# Patient Record
Sex: Female | Born: 1995 | Race: Black or African American | Hispanic: No | Marital: Single | Smoking: Never smoker
Health system: Southern US, Community
[De-identification: ages and names within clinical notes are randomized; demographics above are authoritative.]

## PROBLEM LIST (undated history)

## (undated) DIAGNOSIS — N83209 Unspecified ovarian cyst, unspecified side: Secondary | ICD-10-CM

## (undated) DIAGNOSIS — A749 Chlamydial infection, unspecified: Secondary | ICD-10-CM

## (undated) DIAGNOSIS — D649 Anemia, unspecified: Secondary | ICD-10-CM

## (undated) HISTORY — PX: BREAST REDUCTION SURGERY: SHX8

## (undated) HISTORY — PX: WISDOM TOOTH EXTRACTION: SHX21

## (undated) HISTORY — PX: FRACTURE SURGERY: SHX138

---

## 2015-08-23 ENCOUNTER — Other Ambulatory Visit: Payer: Self-pay | Admitting: Nurse Practitioner

## 2015-08-23 DIAGNOSIS — R102 Pelvic and perineal pain: Secondary | ICD-10-CM

## 2015-08-27 ENCOUNTER — Other Ambulatory Visit: Payer: Self-pay

## 2015-09-03 ENCOUNTER — Other Ambulatory Visit: Payer: Self-pay

## 2015-10-05 ENCOUNTER — Emergency Department (HOSPITAL_COMMUNITY)
Admission: EM | Admit: 2015-10-05 | Discharge: 2015-10-05 | Disposition: A | Discharge status: Home / Self Care | Payer: BLUE CROSS/BLUE SHIELD | Attending: Emergency Medicine | Admitting: Emergency Medicine

## 2015-10-05 ENCOUNTER — Inpatient Hospital Stay (HOSPITAL_COMMUNITY)
Admission: AD | Admit: 2015-10-05 | Discharge: 2015-10-05 | Disposition: A | Discharge status: Home / Self Care | Payer: BLUE CROSS/BLUE SHIELD | Source: Ambulatory Visit | Attending: Obstetrics and Gynecology | Admitting: Obstetrics and Gynecology

## 2015-10-05 ENCOUNTER — Encounter (HOSPITAL_COMMUNITY): Payer: Self-pay | Admitting: Emergency Medicine

## 2015-10-05 ENCOUNTER — Encounter (HOSPITAL_COMMUNITY): Payer: Self-pay | Admitting: *Deleted

## 2015-10-05 DIAGNOSIS — Z8619 Personal history of other infectious and parasitic diseases: Secondary | ICD-10-CM | POA: Diagnosis not present

## 2015-10-05 DIAGNOSIS — R600 Localized edema: Secondary | ICD-10-CM | POA: Diagnosis not present

## 2015-10-05 DIAGNOSIS — Z720 Tobacco use: Secondary | ICD-10-CM | POA: Insufficient documentation

## 2015-10-05 DIAGNOSIS — R102 Pelvic and perineal pain: Secondary | ICD-10-CM | POA: Insufficient documentation

## 2015-10-05 DIAGNOSIS — N898 Other specified noninflammatory disorders of vagina: Secondary | ICD-10-CM

## 2015-10-05 DIAGNOSIS — L089 Local infection of the skin and subcutaneous tissue, unspecified: Secondary | ICD-10-CM | POA: Diagnosis not present

## 2015-10-05 HISTORY — DX: Chlamydial infection, unspecified: A74.9

## 2015-10-05 HISTORY — DX: Unspecified ovarian cyst, unspecified side: N83.209

## 2015-10-05 LAB — CBC WITH DIFFERENTIAL/PLATELET
Basophils Absolute: 0 10*3/uL (ref 0.0–0.1)
Basophils Relative: 1 %
EOS PCT: 2 %
Eosinophils Absolute: 0.1 10*3/uL (ref 0.0–0.7)
HCT: 34.4 % — ABNORMAL LOW (ref 36.0–46.0)
Hemoglobin: 11 g/dL — ABNORMAL LOW (ref 12.0–15.0)
LYMPHS ABS: 2.2 10*3/uL (ref 0.7–4.0)
LYMPHS PCT: 26 %
MCH: 26.1 pg (ref 26.0–34.0)
MCHC: 32 g/dL (ref 30.0–36.0)
MCV: 81.7 fL (ref 78.0–100.0)
MONO ABS: 0.5 10*3/uL (ref 0.1–1.0)
Monocytes Relative: 6 %
Neutro Abs: 5.7 10*3/uL (ref 1.7–7.7)
Neutrophils Relative %: 65 %
PLATELETS: 325 10*3/uL (ref 150–400)
RBC: 4.21 MIL/uL (ref 3.87–5.11)
RDW: 13.5 % (ref 11.5–15.5)
WBC: 8.5 10*3/uL (ref 4.0–10.5)

## 2015-10-05 LAB — WET PREP, GENITAL
Trich, Wet Prep: NONE SEEN
YEAST WET PREP: NONE SEEN

## 2015-10-05 LAB — URINALYSIS, ROUTINE W REFLEX MICROSCOPIC
Bilirubin Urine: NEGATIVE
Glucose, UA: NEGATIVE mg/dL
Hgb urine dipstick: NEGATIVE
Ketones, ur: NEGATIVE mg/dL
LEUKOCYTES UA: NEGATIVE
NITRITE: NEGATIVE
Protein, ur: NEGATIVE mg/dL
SPECIFIC GRAVITY, URINE: 1.02 (ref 1.005–1.030)
UROBILINOGEN UA: 0.2 mg/dL (ref 0.0–1.0)
pH: 7 (ref 5.0–8.0)

## 2015-10-05 LAB — POCT PREGNANCY, URINE: PREG TEST UR: NEGATIVE

## 2015-10-05 MED ORDER — IBUPROFEN 600 MG PO TABS: 600.0000 mg | ORAL_TABLET | Freq: Four times a day (QID) | ORAL | Status: DC | PRN

## 2015-10-05 MED ORDER — IBUPROFEN 600 MG PO TABS
600.0000 mg | ORAL_TABLET | Freq: Once | ORAL | Status: AC
  Administered 2015-10-05: 600 mg via ORAL
  Filled 2015-10-05: qty 1

## 2015-10-05 MED ORDER — TRAMADOL HCL 50 MG PO TABS: 100.0000 mg | ORAL_TABLET | Freq: Four times a day (QID) | ORAL | Status: DC | PRN

## 2015-10-05 MED ORDER — CEPHALEXIN 500 MG PO CAPS: 500.0000 mg | ORAL_CAPSULE | Freq: Four times a day (QID) | ORAL | Status: DC

## 2015-10-05 MED ORDER — TRAMADOL HCL 50 MG PO TABS
100.0000 mg | ORAL_TABLET | Freq: Once | ORAL | Status: AC
  Administered 2015-10-05: 100 mg via ORAL
  Filled 2015-10-05: qty 2

## 2015-10-05 MED ORDER — CEPHALEXIN 500 MG PO CAPS
500.0000 mg | ORAL_CAPSULE | Freq: Once | ORAL | Status: AC
  Administered 2015-10-05: 500 mg via ORAL
  Filled 2015-10-05: qty 1

## 2015-10-05 NOTE — Discharge Instructions (Signed)

## 2015-10-05 NOTE — ED Notes (Signed)
Patient states has "something covering up the hole of my vagina".  Patient states unsure if skin or a cyst?   Patient states is having pain from same.   Patient states she is also having vaginal discharge.   Patient denies other symptoms.

## 2015-10-05 NOTE — MAU Provider Note (Signed)
Chief Complaint: vaginal mass    First Provider Initiated Contact with Patient 10/05/15 1724     SUBJECTIVE HPI: Amber HidesJohnaisha Solis is a 19 y.o. G1P0010 female who was [facility] ED for further eval of vaginal pain and mass. Sx started 1 week ago. No Hx similar Sx. [facility], but pain got so bad that she could wait.    Location: vagina Quality: pressure, tenderness Severity: 5/10 on pain scale Duration: 1 week Course: worsening Context: None Timing: constant Modifying factors: worse w/ sitting and pressure. Associated signs and symptoms: Positive for vaginal discharge. Neg for fever, chills, VB, drainage from mass, injury.  Last IC three weeks ago, consensual, no trauma, but did use a sex toy. No Hx of genital piercings.   Past Medical History  Diagnosis Date  . Ovarian cyst   . Chlamydia    OB History  Gravida Para Term Preterm AB SAB TAB Ectopic Multiple Living  1    1  1        # Outcome Date GA Lbr Len/2nd Weight Sex Delivery Anes PTL Lv  1 TAB              Past Surgical History  Procedure Laterality Date  . Wisdom tooth extraction     Social History   Social History  . Marital Status: Single    Spouse Name: N/A  . Number of Children: N/A  . Years of Education: N/A   Occupational History  . Not on file.   Social History Main Topics  . Smoking status: Current Every Day Smoker    Types: Cigars  . Smokeless tobacco: Not on file  . Alcohol Use: Yes     Comment: socially  . Drug Use: 7.00 per week    Special: Marijuana     Comment: Last used 10/03/15  . Sexual Activity: Yes   Other Topics Concern  . Not on file   Social History Narrative   No current facility-administered medications on file prior to encounter.   No current outpatient prescriptions on file prior to encounter.   No Known Allergies  I have reviewed the past Medical Hx, Surgical Hx, Social Hx, Allergies and Medications.    Review of Systems  Constitutional: Negative for fever and chills.  Gastrointestinal: Negative for abdominal pain.  Genitourinary: Positive for vaginal discharge and vaginal pain. Negative for dysuria, urgency, frequency, hematuria, decreased urine volume, vaginal bleeding, genital sores and pelvic pain.    OBJECTIVE Patient Vitals for the past 24 hrs:  BP Temp Temp src Pulse Resp  10/05/15 1641 125/67 mmHg 98.4 F (36.9 C) Oral 99 18   Constitutional: Well-developed, well-nourished female in mild distress.  Cardiovascular: normal rate Respiratory: normal rate and effort.  GI: Abd soft, non-tender. MS: Extremities nontender, no edema, normal ROM Neurologic: Alert and oriented x 4.  GU: PELVIC EXAM: NEFG. Anterior vaginal wall swollen, erythematous, very tender, non-fluctuant. Physiologic discharge, no blood noted. Unable to tolerate speculum of bimanual exam. Blind wet prep and GC/Chlamydia cultures collected.   LAB RESULTS Results for orders placed or performed during the hospital encounter of 10/05/15 (from the past 24 hour(s))  Urinalysis, Routine w reflex microscopic (not at [facility])     Status: None   Collection Time: 10/05/15  4:45 PM  Result Value Ref Range   Color, Urine YELLOW YELLOW   APPearance CLEAR CLEAR   Specific Gravity, Urine 1.020 1.005 - 1.030   pH 7.0 5.0 -  8.0   Glucose, UA NEGATIVE NEGATIVE mg/dL   Hgb urine dipstick NEGATIVE NEGATIVE   Bilirubin Urine NEGATIVE NEGATIVE   Ketones, ur NEGATIVE NEGATIVE mg/dL   Protein, ur NEGATIVE NEGATIVE mg/dL   Urobilinogen, UA 0.2 0.0 - 1.0 mg/dL   Nitrite NEGATIVE NEGATIVE   Leukocytes, UA NEGATIVE NEGATIVE  Pregnancy, urine POC     Status: None   Collection Time: 10/05/15  4:59 PM  Result Value Ref Range   Preg Test, Ur NEGATIVE NEGATIVE  Wet prep, genital     Status: Abnormal   Collection Time: 10/05/15  5:25 PM  Result Value Ref Range   Yeast Wet Prep HPF POC NONE SEEN NONE SEEN   Trich, Wet Prep NONE SEEN  NONE SEEN   Clue Cells Wet Prep HPF POC FEW (A) NONE SEEN   WBC, Wet Prep HPF POC FEW (A) NONE SEEN  CBC with Differential/Platelet     Status: Abnormal   Collection Time: 10/05/15  5:50 PM  Result Value Ref Range   WBC 8.5 4.0 - 10.5 K/uL   RBC 4.21 3.87 - 5.11 MIL/uL   Hemoglobin 11.0 (L) 12.0 - 15.0 g/dL   HCT 40.9 (L) 81.1 - 91.4 %   MCV 81.7 78.0 - 100.0 fL   MCH 26.1 26.0 - 34.0 pg   MCHC 32.0 30.0 - 36.0 g/dL   RDW 78.2 95.6 - 21.3 %   Platelets 325 150 - 400 K/uL   Neutrophils Relative % 65 %   Neutro Abs 5.7 1.7 - 7.7 K/uL   Lymphocytes Relative 26 %   Lymphs Abs 2.2 0.7 - 4.0 K/uL   Monocytes Relative 6 %   Monocytes Absolute 0.5 0.1 - 1.0 K/uL   Eosinophils Relative 2 %   Eosinophils Absolute 0.1 0.0 - 0.7 K/uL   Basophils Relative 1 %   Basophils Absolute 0.0 0.0 - 0.1 K/uL    IMAGING No results found.  MAU COURSE/MDM Discussed symptoms, exam and history with Dr. Jolayne Panther. Symptoms most likely caused by soft tissue infection. Recommends treatment with Keflex and follow up with gynecologist if no improvement in 3 days.  CBC, Ultram, Keflex.  ASSESSMENT 1. Soft tissue infection   2. Vaginal mass     PLAN Discharge home in stable condition. Pelvic rest until Sx resolve.  Frequent soaks and warm compresses.       Follow-up Information    [facility].   Why:  As scheduled      Follow up with [facility] [place] [facility].   Why:  As needed if symptoms do not improve or worsen in 72 hours   Contact information:   [address] [id number] mc [place] [address] [phone]       Medication List    TAKE these medications        cephALEXin 500 MG capsule  Commonly known as:  KEFLEX  Take 1 capsule (500 mg total) by mouth 4 (four) times daily.     cetirizine 10 MG tablet  Commonly known as:  ZYRTEC  Take 10 mg by mouth daily as needed for allergies.      ibuprofen 600 MG tablet  Commonly known as:  ADVIL,MOTRIN  Take 1 tablet (600 mg total) by mouth every 6 (six) hours as needed for moderate pain.     traMADol 50 MG tablet  Commonly known as:  ULTRAM  Take 2 tablets (100 mg total) by mouth every 6 (  six) hours as needed. For breakthrough pain.       Grant, CNM 10/05/2015  6:12 PM

## 2015-10-05 NOTE — MAU Note (Signed)
Pt sent to MAU from [facility].  Pt was told she has ? Vaginal mass.  Is very painful to touch & to sit, has been there a week.  Denies bleeding, has white discharge.

## 2015-10-05 NOTE — Progress Notes (Signed)
Written and verbal d/c instructions given and understanding voiced. 

## 2015-10-05 NOTE — Discharge Instructions (Signed)
Proceed to women's outpatient clinic for further evaluation by Dr. Ladean Rayaonstance.

## 2015-10-05 NOTE — ED Provider Notes (Signed)
CSN: [id number]     Arrival date & time 10/05/15  1422 History   First MD Initiated Contact with Patient 10/05/15 1518     Chief Complaint  Patient presents with  . Vaginal Pain     (Consider location/radiation/quality/duration/timing/severity/associated sxs/prior Treatment) Patient is a 19 y.o. female presenting with vaginal pain. The history is provided by the patient. No language interpreter was used.  Vaginal Pain Pertinent negatives include no abdominal pain or fever.  Mrs. Tech is a 19 year old female with a history of ovarian cyst who presents stating that she feels as though something is covering of the whole of her vagina. She states she noticed it last week and feels that it is hard. She reports having a history of chlamydia in the past. Also complaining of nonodorous white vaginal discharge. She states she is sexually active but without penetration and with females only. She denies any fever, chills, abdominal pain, nausea, vomiting, vaginal bleeding, vaginal itching or odor. Her LMP was 09/22/2015.  Past Medical History  Diagnosis Date  . Ovarian cyst    History reviewed. No pertinent past surgical history. No family history on file. Social History  Substance Use Topics  . Smoking status: Current Every Day Smoker    Types: Cigars  . Smokeless tobacco: None  . Alcohol Use: Yes     Comment: socially   OB History    No data available     Review of Systems  Constitutional: Negative for fever.  Gastrointestinal: Negative for abdominal pain.  Genitourinary: Positive for vaginal discharge and vaginal pain. Negative for vaginal bleeding.  All other systems reviewed and are negative.     Allergies  Review of patient's allergies indicates no known allergies.  Home Medications   Prior to Admission medications   Not on File   BP 134/80 mmHg  Pulse 86  Temp(Src) 98.1 F (36.7 C) (Oral)  Resp 18  Ht  (1.702 m)  Wt 224 lb (101.606 kg)  BMI 35.08 kg/m2   SpO2 100%  LMP 09/22/2015 Physical Exam  Constitutional: She is oriented to person, place, and time. She appears well-developed and well-nourished.  HENT:  Head: Normocephalic and atraumatic.  Eyes: Conjunctivae are normal.  Neck: Neck supple.  Cardiovascular: Normal rate.   Pulmonary/Chest: Effort normal. No respiratory distress.  Abdominal: Soft. There is no tenderness. There is no rebound and no guarding.  No abdominal tenderness to palpation. No guarding or rebound.  Genitourinary: Pelvic exam was performed with patient supine.  Pelvic exam: Chaperone present. There is a indurated area covering the opening of the vagina which is tender to palpation. It is also draining. No vaginal, uterine, or rectal prolapse.  There is no vaginal bleeding. Speculum exam could not be performed due to mass. Cervix could not be seen.  Musculoskeletal: Normal range of motion.  Neurological: She is alert and oriented to person, place, and time.  Skin: Skin is warm and dry.    ED Course  Procedures (including critical care time) Labs Review Labs Reviewed  URINALYSIS, ROUTINE W REFLEX MICROSCOPIC (NOT AT [facility])    Imaging Review No results found.   EKG Interpretation None      MDM   Final diagnoses:  Vaginal mass   Patient presents for mass at the vaginal opening. It does not appear to be a prolapse or a cyst. It also does not appear to be an abscess. She is afebrile and vitals are stable. I discussed this patient with Dr. Lynelle Doctor who is  seen and evaluated the patient. He recommended calling GYN to see if the patient would need antibiotics or an ultrasound of the pelvis. I spoke to Dr. Ladean Rayaonstance with GYN who recommended that the patient be transferred over to MAU to be evaluated by her. I explained the plan with the patient and she will be driving herself to MAU.    Catha GosselinHanna Patel-Mills, PA-C 10/05/15 1610  Nelva Nayobert Beaton, MD 10/06/15 [id number]

## 2015-10-06 LAB — GC/CHLAMYDIA PROBE AMP (~~LOC~~) NOT AT ARMC
Chlamydia: NEGATIVE
NEISSERIA GONORRHEA: NEGATIVE

## 2015-10-09 ENCOUNTER — Inpatient Hospital Stay (HOSPITAL_COMMUNITY)
Admission: AD | Admit: 2015-10-09 | Discharge: 2015-10-09 | Disposition: A | Discharge status: Home / Self Care | Payer: BLUE CROSS/BLUE SHIELD | Source: Ambulatory Visit | Attending: Family Medicine | Admitting: Family Medicine

## 2015-10-09 ENCOUNTER — Encounter (HOSPITAL_COMMUNITY): Payer: Self-pay | Admitting: *Deleted

## 2015-10-09 DIAGNOSIS — N899 Noninflammatory disorder of vagina, unspecified: Secondary | ICD-10-CM | POA: Insufficient documentation

## 2015-10-09 DIAGNOSIS — M549 Dorsalgia, unspecified: Secondary | ICD-10-CM | POA: Diagnosis present

## 2015-10-09 DIAGNOSIS — F17200 Nicotine dependence, unspecified, uncomplicated: Secondary | ICD-10-CM | POA: Insufficient documentation

## 2015-10-09 DIAGNOSIS — N898 Other specified noninflammatory disorders of vagina: Secondary | ICD-10-CM

## 2015-10-09 HISTORY — DX: Anemia, unspecified: D64.9

## 2015-10-09 NOTE — Discharge Instructions (Signed)
Cephalexin tablets or capsules  What is this medicine?  CEPHALEXIN (sef a LEX in) is a cephalosporin antibiotic. It is used to treat certain kinds of bacterial infections It will not work for colds, flu, or other viral infections.  This medicine may be used for other purposes; ask your health care provider or pharmacist if you have questions.  What should I tell my health care provider before I take this medicine?  They need to know if you have any of these conditions:  -kidney disease  -stomach or intestine problems, especially colitis  -an unusual or allergic reaction to cephalexin, other cephalosporins, penicillins, other antibiotics, medicines, foods, dyes or preservatives  -pregnant or trying to get pregnant  -breast-feeding  How should I use this medicine?  Take this medicine by mouth with a full glass of water. Follow the directions on the prescription label. This medicine can be taken with or without food. Take your medicine at regular intervals. Do not take your medicine more often than directed. Take all of your medicine as directed even if you think you are better. Do not skip doses or stop your medicine early.  Talk to your pediatrician regarding the use of this medicine in children. While this drug may be prescribed for selected conditions, precautions do apply.  Overdosage: If you think you have taken too much of this medicine contact a poison control center or emergency room at once.  NOTE: This medicine is only for you. Do not share this medicine with others.  What if I miss a dose?  If you miss a dose, take it as soon as you can. If it is almost time for your next dose, take only that dose. Do not take double or extra doses. There should be at least 4 to 6 hours between doses.  What may interact with this medicine?  -probenecid  -some other antibiotics  This list may not describe all possible interactions. Give your health care provider a list of all the medicines, herbs, non-prescription drugs, or  dietary supplements you use. Also tell them if you smoke, drink alcohol, or use illegal drugs. Some items may interact with your medicine.  What should I watch for while using this medicine?  Tell your doctor or health care professional if your symptoms do not begin to improve in a few days.  Do not treat diarrhea with over the counter products. Contact your doctor if you have diarrhea that lasts more than 2 days or if it is severe and watery.  If you have diabetes, you may get a false-positive result for sugar in your urine. Check with your doctor or health care professional.  What side effects may I notice from receiving this medicine?  Side effects that you should report to your doctor or health care professional as soon as possible:  -allergic reactions like skin rash, itching or hives, swelling of the face, lips, or tongue  -breathing problems  -pain or trouble passing urine  -redness, blistering, peeling or loosening of the skin, including inside the mouth  -severe or watery diarrhea  -unusually weak or tired  -yellowing of the eyes, skin  Side effects that usually do not require medical attention (report to your doctor or health care professional if they continue or are bothersome):  -gas or heartburn  -genital or anal irritation  -headache  -joint or muscle pain  -nausea, vomiting  This list may not describe all possible side effects. Call your doctor for medical advice about side effects.   You may report side effects to FDA at 1-800-FDA-1088.  Where should I keep my medicine?  Keep out of the reach of children.  Store at room temperature between 59 and 86 degrees F (15 and 30 degrees C). Throw away any unused medicine after the expiration date.  NOTE: This sheet is a summary. It may not cover all possible information. If you have questions about this medicine, talk to your doctor, pharmacist, or health care provider.     © 2016, [employer]vier/Gold Standard. (2008-02-17 17:09:13)

## 2015-10-09 NOTE — MAU Note (Addendum)
Day after she was here, had a lot of brown d/c.  Smear of blood yesterday. Some back pain last couple days.  Pain today and small amt of bleeding.  Did NOT have appt yesterday- they were closed.  Came in today because of the pain and she was unsure about the blood

## 2015-10-09 NOTE — MAU Provider Note (Signed)
  History     CSN: [id number]  Arrival date and time: 10/09/15 1831   First Provider Initiated Contact with Patient 10/09/15 1911      Chief Complaint  Patient presents with  . Back Pain  . Vaginal Bleeding   HPI  Amber Solis 19 y.o. G1P0010 presents because she is having vaginal spotting and some back pain. She was treated with Keflex states the mass has gone down . She has had brown discharge yesterday.  Past Medical History  Diagnosis Date  . Ovarian cyst   . Chlamydia   . Anemia     Past Surgical History  Procedure Laterality Date  . Wisdom tooth extraction      History reviewed. No pertinent family history.  Social History  Substance Use Topics  . Smoking status: Current Every Day Smoker    Types: Cigars  . Smokeless tobacco: None  . Alcohol Use: Yes     Comment: socially    Allergies: No Known Allergies  Prescriptions prior to admission  Medication Sig Dispense Refill Last Dose  . cephALEXin (KEFLEX) 500 MG capsule Take 1 capsule (500 mg total) by mouth 4 (four) times daily. 28 capsule 0 10/08/2015 at Unknown time  . ibuprofen (ADVIL,MOTRIN) 600 MG tablet Take 1 tablet (600 mg total) by mouth every 6 (six) hours as needed for moderate pain. 30 tablet 0 10/08/2015 at Unknown time  . traMADol (ULTRAM) 50 MG tablet Take 2 tablets (100 mg total) by mouth every 6 (six) hours as needed. For breakthrough pain. 30 tablet 0 10/08/2015 at Unknown time  . cetirizine (ZYRTEC) 10 MG tablet Take 10 mg by mouth daily as needed for allergies.   prn    Review of Systems  Gastrointestinal: Positive for abdominal pain.  Genitourinary:       Vaginal spotting  Musculoskeletal: Positive for back pain.  All other systems reviewed and are negative.  Physical Exam   Blood pressure 139/73, pulse 87, temperature 97.8 F (36.6 C), temperature source Oral, resp. rate 18, last menstrual period 09/22/2015.  Physical Exam  Nursing note and vitals reviewed. Constitutional:  She is oriented to person, place, and time. She appears well-developed and well-nourished. No distress.  HENT:  Head: Normocephalic and atraumatic.  Neck: Normal range of motion.  Cardiovascular: Normal rate.   Respiratory: Effort normal. No respiratory distress.  GI: Soft.  Genitourinary: Vagina normal.  Musculoskeletal: Normal range of motion.  Neurological: She is alert and oriented to person, place, and time.  Skin: Skin is warm and dry.  Psychiatric: She has a normal mood and affect. Her behavior is normal. Judgment and thought content normal.    MAU Course  Procedures  MDM Continue prescribed treatment [facility]. No results found for this or any previous visit (from the past 24 hour(s)). Assessment and Plan  Vaginal Mass  Discharge to home  [facility] CtrClemmons,Rui Wordell Grissett 10/09/2015, 7:24 PM

## 2017-06-02 ENCOUNTER — Encounter (HOSPITAL_COMMUNITY): Payer: Self-pay | Admitting: *Deleted

## 2017-06-02 ENCOUNTER — Ambulatory Visit (HOSPITAL_COMMUNITY)
Admission: EM | Admit: 2017-06-02 | Discharge: 2017-06-02 | Disposition: A | Discharge status: Home / Self Care | Payer: 59 | Attending: Family Medicine | Admitting: Family Medicine

## 2017-06-02 DIAGNOSIS — R21 Rash and other nonspecific skin eruption: Secondary | ICD-10-CM | POA: Diagnosis not present

## 2017-06-02 DIAGNOSIS — S90861A Insect bite (nonvenomous), right foot, initial encounter: Secondary | ICD-10-CM

## 2017-06-02 DIAGNOSIS — W57XXXA Bitten or stung by nonvenomous insect and other nonvenomous arthropods, initial encounter: Secondary | ICD-10-CM | POA: Diagnosis not present

## 2017-06-02 DIAGNOSIS — S90862A Insect bite (nonvenomous), left foot, initial encounter: Secondary | ICD-10-CM

## 2017-06-02 MED ORDER — TRIAMCINOLONE ACETONIDE 0.1 % EX CREA: 1.0000 "application " | TOPICAL_CREAM | Freq: Two times a day (BID) | CUTANEOUS | 0 refills | Status: AC

## 2017-06-02 NOTE — ED Provider Notes (Signed)
CSN: [id number]     Arrival date & time 06/02/17  1452 History   None    Chief Complaint  Patient presents with  . Foot Problem   (Consider location/radiation/quality/duration/timing/severity/associated sxs/prior Treatment)  Rash  Location:  Foot Foot rash location:  R foot and L foot Quality: itchiness and redness   Quality: not blistering, not burning, not draining, not painful, not swelling and not weeping   Onset quality:  Sudden Duration:  3 days Timing:  Constant Progression:  Worsening Chronicity:  New Context: not animal contact, not exposure to similar rash, not insect bite/sting, not medications, not new detergent/soap and not plant contact   Relieved by:  Nothing Worsened by:  Nothing Ineffective treatments:  None tried   Past Medical History:  Diagnosis Date  . Anemia   . Chlamydia   . Ovarian cyst    Past Surgical History:  Procedure Laterality Date  . WISDOM TOOTH EXTRACTION     History reviewed. No pertinent family history. Social History  Substance Use Topics  . Smoking status: Current Every Day Smoker    Types: Cigars  . Smokeless tobacco: Not on file  . Alcohol use Yes     Comment: socially   OB History    Gravida Para Term Preterm AB Living   1       1     SAB TAB Ectopic Multiple Live Births     1           Review of Systems  Constitutional:       See HPI  Skin: Positive for rash.    Allergies  Patient has no known allergies.  Home Medications   Prior to Admission medications   Medication Sig Start Date End Date Taking? Authorizing Provider  cephALEXin (KEFLEX) 500 MG capsule Take 1 capsule (500 mg total) by mouth 4 (four) times daily. 10/05/15   Katrinka Blazing, [place], CNM  cetirizine (ZYRTEC) 10 MG tablet Take 10 mg by mouth daily as needed for allergies.    [provider]  ibuprofen (ADVIL,MOTRIN) 600 MG tablet Take 1 tablet (600 mg total) by mouth every 6 (six) hours as needed for moderate pain. 10/05/15   Katrinka Blazing, [place], CNM   traMADol (ULTRAM) 50 MG tablet Take 2 tablets (100 mg total) by mouth every 6 (six) hours as needed. For breakthrough pain. 10/05/15   Katrinka Blazing, [place], CNM  triamcinolone cream (KENALOG) 0.1 % Apply 1 application topically 2 (two) times daily. 06/02/17 06/09/17  Lucia Estelle, NP   Meds Ordered and Administered this Visit  Medications - No data to display  BP 130/69 (BP Location: Right Arm)   Pulse 75   Temp 98.7 F (37.1 C) (Oral)   Resp 18   LMP 06/02/2017   SpO2 100%  No data found.   Physical Exam  Constitutional: She is oriented to person, place, and time. She appears well-developed and well-nourished.  Cardiovascular: Normal rate.   Pulmonary/Chest: Effort normal.  Neurological: She is alert and oriented to person, place, and time.  Skin:  Erythematous distinct papular rash on feet and some on lower legs  Nursing note and vitals reviewed.   Urgent Care Course     Procedures (including critical care time)  Labs Review Labs Reviewed - No data to display  Imaging Review No results found.  MDM   1. Insect bite, initial encounter    RX for Kenalog cream 0/1% send in.  Reviewed directions for usage and side effects. Patient states understanding and  will call with questions or problems. Patient instructed to call or follow up with his/her primary care doctor if failure to improve or change in symptoms. Discharge instruction given.     Lucia EstelleZheng, Rachard Isidro, NP 06/02/17 1622

## 2017-06-02 NOTE — ED Triage Notes (Signed)
Bumps  On  Both   Feet  X   3  Days       Itches        no  Known  Causative         Agent  No  New  Shoes

## 2017-09-08 ENCOUNTER — Ambulatory Visit (HOSPITAL_COMMUNITY)
Admission: EM | Admit: 2017-09-08 | Discharge: 2017-09-08 | Disposition: A | Discharge status: Home / Self Care | Payer: 59 | Attending: Internal Medicine | Admitting: Internal Medicine

## 2017-09-08 ENCOUNTER — Encounter (HOSPITAL_COMMUNITY): Payer: Self-pay | Admitting: Emergency Medicine

## 2017-09-08 DIAGNOSIS — J209 Acute bronchitis, unspecified: Secondary | ICD-10-CM

## 2017-09-08 MED ORDER — BENZONATATE 200 MG PO CAPS: 200.0000 mg | ORAL_CAPSULE | Freq: Three times a day (TID) | ORAL | 1 refills | Status: DC | PRN

## 2017-09-08 MED ORDER — PREDNISONE 50 MG PO TABS: 50.0000 mg | ORAL_TABLET | Freq: Every day | ORAL | 0 refills | Status: DC

## 2017-09-08 NOTE — Discharge Instructions (Addendum)
Anticipate gradual improvement in cough and well-being over the next several days. Cough may take a couple weeks to subside. Prescriptions for prednisone (a steroid, to help with cough and congestion), and for benzonatate, for cough, were sent to the pharmacy. Rest and push fluids.  Recheck for new fever >100.5, increasing phlegm production/nasal discharge, or if not starting to improve in a few days.

## 2017-09-08 NOTE — ED Provider Notes (Signed)
[facility]    CSN: [id number] Arrival date & time: 09/08/17  1601     History   Chief Complaint Chief Complaint  Patient presents with  . URI    HPI Amber Solis is a 21 y.o. female. She presents today with about a 6 day history of head congestion, some runny nose, cough, headache. She has some tactile temperature. She has some discomfort in her back when she coughs. She started wheezing yesterday. Appetite is a little decreased and she has mild nausea. No vomiting. Some loose stools yesterday. Mild sore throat initially.    HPI  Past Medical History:  Diagnosis Date  . Anemia   . Chlamydia   . Ovarian cyst     Past Surgical History:  Procedure Laterality Date  . WISDOM TOOTH EXTRACTION      OB History    Gravida Para Term Preterm AB Living   1       1     SAB TAB Ectopic Multiple Live Births     1             Home Medications    Prior to Admission medications   Medication Sig Start Date End Date Taking? Authorizing Provider  benzonatate (TESSALON) 200 MG capsule Take 1 capsule (200 mg total) by mouth 3 (three) times daily as needed for cough. 09/08/17   Eustace Moore, MD  cetirizine (ZYRTEC) 10 MG tablet Take 10 mg by mouth daily as needed for allergies.    [provider]  ibuprofen (ADVIL,MOTRIN) 600 MG tablet Take 1 tablet (600 mg total) by mouth every 6 (six) hours as needed for moderate pain. 10/05/15   Katrinka Blazing, [place], CNM  predniSONE (DELTASONE) 50 MG tablet Take 1 tablet (50 mg total) by mouth daily. 09/08/17   Eustace Moore, MD    Family History History reviewed. No pertinent family history.  Social History Social History  Substance Use Topics  . Smoking status: Current Every Day Smoker    Types: Cigars  . Smokeless tobacco: Never Used  . Alcohol use Yes     Comment: socially     Allergies   Patient has no known allergies.   Review of Systems Review of Systems  All other systems reviewed and are  negative.    Physical Exam Triage Vital Signs ED Triage Vitals  Enc Vitals Group     BP 09/08/17 1630 125/64     Pulse Rate 09/08/17 1630 95     Resp 09/08/17 1630 18     Temp 09/08/17 1630 98.8 F (37.1 C)     Temp Source 09/08/17 1630 Oral     SpO2 09/08/17 1630 98 %     Weight --      Height --      Pain Score 09/08/17 1629 3     Pain Loc --    Updated Vital Signs BP 125/64 (BP Location: Left Arm)   Pulse 95   Temp 98.8 F (37.1 C) (Oral)   Resp 18   LMP 08/27/2017   SpO2 98%   Physical Exam  Constitutional: She is oriented to person, place, and time. No distress.  HENT:  Head: Atraumatic.  Voice sounds a little congested, and cough is wheezy Bilateral TMs are mildly dull, no erythema Moderate nasal congestion bilaterally Throat is somewhat injected, with clear postnasal drainage  Eyes:  Conjugate gaze observed, no eye redness/discharge  Neck: Neck supple.  Cardiovascular: Normal rate and regular rhythm.  Pulmonary/Chest: No respiratory distress. She has no rales.  Soft inspiratory and expiratory wheezing throughout, good air excursion, nonfocal  Abdominal: She exhibits no distension.  Musculoskeletal: Normal range of motion.  Neurological: She is alert and oriented to person, place, and time.  Skin: Skin is warm and dry.  Nursing note and vitals reviewed.    UC Treatments / Results   Procedures Procedures (including critical care time) None today  Final Clinical Impressions(s) / UC Diagnoses   Final diagnoses:  Acute bronchitis, unspecified organism   Anticipate gradual improvement in cough and well-being over the next several days. Cough may take a couple weeks to subside. Prescriptions for prednisone (a steroid, to help with cough and congestion), and for benzonatate, for cough, were sent to the pharmacy. Rest and push fluids.  Recheck for new fever >100.5, increasing phlegm production/nasal discharge, or if not starting to improve in a few days.      New Prescriptions New Prescriptions   BENZONATATE (TESSALON) 200 MG CAPSULE    Take 1 capsule (200 mg total) by mouth 3 (three) times daily as needed for cough.   PREDNISONE (DELTASONE) 50 MG TABLET    Take 1 tablet (50 mg total) by mouth daily.     Controlled Substance Prescriptions [place] Controlled Substance Registry consulted? No   Eustace Moore, MD 09/09/17 [phone]

## 2017-09-08 NOTE — ED Triage Notes (Signed)
Pt c/o cold sx onset 3 days associated w/prod cough, nasal congestion, HAs, decreased appetite, wheezing  Denies fevers, chills  Smokes marijuana everyday   A&O x4... NAD... Ambulatory

## 2018-01-08 ENCOUNTER — Ambulatory Visit (HOSPITAL_COMMUNITY)
Admission: EM | Admit: 2018-01-08 | Discharge: 2018-01-08 | Disposition: A | Discharge status: Home / Self Care | Payer: No Typology Code available for payment source | Attending: Family Medicine | Admitting: Family Medicine

## 2018-01-08 ENCOUNTER — Encounter (HOSPITAL_COMMUNITY): Payer: Self-pay | Admitting: Emergency Medicine

## 2018-01-08 DIAGNOSIS — Z3202 Encounter for pregnancy test, result negative: Secondary | ICD-10-CM | POA: Diagnosis not present

## 2018-01-08 DIAGNOSIS — H1031 Unspecified acute conjunctivitis, right eye: Secondary | ICD-10-CM | POA: Diagnosis not present

## 2018-01-08 LAB — POCT PREGNANCY, URINE: PREG TEST UR: NEGATIVE

## 2018-01-08 MED ORDER — TETRACAINE HCL 0.5 % OP SOLN
OPHTHALMIC | Status: AC
  Filled 2018-01-08: qty 4

## 2018-01-08 MED ORDER — OFLOXACIN 0.3 % OP SOLN: OPHTHALMIC | 0 refills | Status: DC

## 2018-01-08 MED ORDER — POLYETHYL GLYCOL-PROPYL GLYCOL 0.4-0.3 % OP GEL: 1.0000 "application " | Freq: Every evening | OPHTHALMIC | 0 refills | Status: DC | PRN

## 2018-01-08 NOTE — ED Provider Notes (Signed)
[facility]    CSN: [id number] Arrival date & time: 01/08/18  1405     History   Chief Complaint Chief Complaint  Patient presents with  . Eye Problem    HPI Amber Solis is a 22 y.o. female.   22 year old female comes in for 2-day history of right eye redness, pain, drainage.  She has had eye crusting in the mornings.  Has also had slight swelling of the right upper eyelid.  States she is under some blurry vision as well, and mild photophobia.  States she is supposed to wear glasses, but did not bring it today.  Denies contact lens use.  She is tried some OTC eyedrops with some relief.   She is also requesting pregnancy test due to lighter cycle this month.      Past Medical History:  Diagnosis Date  . Anemia   . Chlamydia   . Ovarian cyst     There are no active problems to display for this patient.   Past Surgical History:  Procedure Laterality Date  . BREAST REDUCTION SURGERY    . WISDOM TOOTH EXTRACTION      OB History    Gravida Para Term Preterm AB Living   1       1     SAB TAB Ectopic Multiple Live Births     1             Home Medications    Prior to Admission medications   Medication Sig Start Date End Date Taking? Authorizing Provider  benzonatate (TESSALON) 200 MG capsule Take 1 capsule (200 mg total) by mouth 3 (three) times daily as needed for cough. 09/08/17   Isa RankinMurray, Laura Wilson, MD  cetirizine (ZYRTEC) 10 MG tablet Take 10 mg by mouth daily as needed for allergies.    [provider]  ibuprofen (ADVIL,MOTRIN) 600 MG tablet Take 1 tablet (600 mg total) by mouth every 6 (six) hours as needed for moderate pain. 10/05/15   Katrinka BlazingSmith, [place], CNM  ofloxacin (OCUFLOX) 0.3 % ophthalmic solution 1 drop in right eye every 4 hours for the first 2 days, 4 times a day for the next 5 days. 01/08/18   Cathie HoopsYu, Julienne Vogler V, PA-C  Polyethyl Glycol-Propyl Glycol (SYSTANE) 0.4-0.3 % GEL ophthalmic gel Place 1 application into both eyes at bedtime  as needed. 01/08/18   Cathie HoopsYu, Krystiana Fornes V, PA-C  predniSONE (DELTASONE) 50 MG tablet Take 1 tablet (50 mg total) by mouth daily. 09/08/17   Isa RankinMurray, Laura Wilson, MD    Family History No family history on file.  Social History Social History   Tobacco Use  . Smoking status: Current Every Day Smoker    Types: Cigars  . Smokeless tobacco: Never Used  Substance Use Topics  . Alcohol use: Yes    Comment: socially  . Drug use: Yes    Frequency: 7.0 times per week    Types: Marijuana    Comment: Last used 10/03/15     Allergies   Patient has no known allergies.   Review of Systems Review of Systems  Reason unable to perform ROS: See HPI as above.     Physical Exam Triage Vital Signs ED Triage Vitals  Enc Vitals Group     BP 01/08/18 1426 124/68     Pulse Rate 01/08/18 1426 82     Resp 01/08/18 1426 16     Temp 01/08/18 1426 98.3 F (36.8 C)     Temp Source 01/08/18 1426  Oral     SpO2 01/08/18 1426 100 %     Weight 01/08/18 1427 215 lb (97.5 kg)     Height 01/08/18 1427 5\' 7"  (1.702 m)     Head Circumference --      Peak Flow --      Pain Score 01/08/18 1426 0     Pain Loc --      Pain Edu? --      Excl. in GC? --    No data found.  Updated Vital Signs BP 124/68   Pulse 82   Temp 98.3 F (36.8 C) (Oral)   Resp 16   Ht 5\' 7"  (1.702 m)   Wt 215 lb (97.5 kg)   LMP 12/31/2017   SpO2 100%   BMI 33.67 kg/m   Visual Acuity Right Eye Distance: 20/50 Left Eye Distance: 20/40 Bilateral Distance: 20/25  Right Eye Near:   Left Eye Near:    Bilateral Near:     Physical Exam  Constitutional: She is oriented to person, place, and time. She appears well-developed and well-nourished. No distress.  HENT:  Head: Normocephalic and atraumatic.  Eyes: EOM and lids are normal. Pupils are equal, round, and reactive to light. Lids are everted and swept, no foreign bodies found. Right conjunctiva is injected.  Slight swelling of the right upper eyelid without hordeolum.  No  ciliary injection.  Fluorescein stain without uptake.  Neurological: She is alert and oriented to person, place, and time.     UC Treatments / Results  Labs (all labs ordered are listed, but only abnormal results are displayed) Labs Reviewed  POCT PREGNANCY, URINE    EKG  EKG Interpretation None       Radiology No results found.  Procedures Procedures (including critical care time)  Medications Ordered in UC Medications - No data to display   Initial Impression / Assessment and Plan / UC Course  I have reviewed the triage vital signs and the nursing notes.  Pertinent labs & imaging results that were available during my care of the patient were reviewed by me and considered in my medical decision making (see chart for details).    Start Ofloxacin drops as directed. Artificial tears gel as directed. Lid scrubs and warm compresses as directed. Patient to follow up with ophthalmology if symptoms worsens or does not improve. Return precautions given.   Final Clinical Impressions(s) / UC Diagnoses   Final diagnoses:  Acute conjunctivitis of right eye, unspecified acute conjunctivitis type    ED Discharge Orders        Ordered    ofloxacin (OCUFLOX) 0.3 % ophthalmic solution     01/08/18 1512    Polyethyl Glycol-Propyl Glycol (SYSTANE) 0.4-0.3 % GEL ophthalmic gel  At bedtime PRN     01/08/18 1512        Belinda Fisher, PA-C 01/08/18 1516

## 2018-01-08 NOTE — ED Triage Notes (Signed)
PT reports right eye redness, pain, drainage for 2 days. It is improving with OTC drops. PT reports slight blurred vision.  PT would like preg test due to lighter period this month.

## 2018-01-08 NOTE — Discharge Instructions (Signed)
Use ofloxacin eyedrops as directed on right eye. Artificial tear gel at night. Wait 10-15 minutes between drops, always use artificial tear gel last, as it prevents drops from penetrating through. Lid scrubs and warm compresses as directed. Monitor for any worsening of symptoms, changes in vision, sensitivity to light, eye swelling, follow up with ophthalmology for further evaluation.   Urine pregnancy test negative.

## 2018-01-17 ENCOUNTER — Emergency Department (HOSPITAL_COMMUNITY)
Admission: EM | Admit: 2018-01-17 | Discharge: 2018-01-17 | Disposition: A | Discharge status: Home / Self Care | Payer: No Typology Code available for payment source | Attending: Emergency Medicine | Admitting: Emergency Medicine

## 2018-01-17 ENCOUNTER — Emergency Department (HOSPITAL_COMMUNITY): Payer: No Typology Code available for payment source

## 2018-01-17 ENCOUNTER — Encounter (HOSPITAL_COMMUNITY): Payer: Self-pay | Admitting: *Deleted

## 2018-01-17 ENCOUNTER — Other Ambulatory Visit: Payer: Self-pay

## 2018-01-17 DIAGNOSIS — Z79899 Other long term (current) drug therapy: Secondary | ICD-10-CM | POA: Insufficient documentation

## 2018-01-17 DIAGNOSIS — Y999 Unspecified external cause status: Secondary | ICD-10-CM | POA: Insufficient documentation

## 2018-01-17 DIAGNOSIS — Y939 Activity, unspecified: Secondary | ICD-10-CM | POA: Diagnosis not present

## 2018-01-17 DIAGNOSIS — M791 Myalgia, unspecified site: Secondary | ICD-10-CM

## 2018-01-17 DIAGNOSIS — M542 Cervicalgia: Secondary | ICD-10-CM | POA: Insufficient documentation

## 2018-01-17 DIAGNOSIS — Y929 Unspecified place or not applicable: Secondary | ICD-10-CM | POA: Diagnosis not present

## 2018-01-17 MED ORDER — METHOCARBAMOL 500 MG PO TABS: 500.0000 mg | ORAL_TABLET | Freq: Every evening | ORAL | 0 refills | Status: DC | PRN

## 2018-01-17 MED ORDER — METHOCARBAMOL 500 MG PO TABS
500.0000 mg | ORAL_TABLET | Freq: Once | ORAL | Status: AC
  Administered 2018-01-17: 500 mg via ORAL
  Filled 2018-01-17: qty 1

## 2018-01-17 MED ORDER — IBUPROFEN 600 MG PO TABS: 600.0000 mg | ORAL_TABLET | Freq: Four times a day (QID) | ORAL | 0 refills | Status: DC | PRN

## 2018-01-17 MED ORDER — ACETAMINOPHEN 325 MG PO TABS
650.0000 mg | ORAL_TABLET | Freq: Once | ORAL | Status: AC
Start: 2018-01-17 — End: 2018-01-17
  Administered 2018-01-17: 650 mg via ORAL
  Filled 2018-01-17: qty 2
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
## 2018-01-17 NOTE — ED Notes (Signed)
Declined W/C at D/C and was escorted to lobby by RN. 
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
## 2018-11-12 ENCOUNTER — Encounter: Payer: Self-pay | Admitting: Gastroenterology

## 2018-11-27 ENCOUNTER — Inpatient Hospital Stay (HOSPITAL_COMMUNITY)
Admission: AD | Admit: 2018-11-27 | Discharge: 2018-11-27 | Disposition: A | Discharge status: Home / Self Care | Payer: BLUE CROSS/BLUE SHIELD | Attending: Obstetrics and Gynecology | Admitting: Obstetrics and Gynecology

## 2018-11-27 ENCOUNTER — Encounter (HOSPITAL_COMMUNITY): Payer: Self-pay

## 2018-11-27 DIAGNOSIS — R300 Dysuria: Secondary | ICD-10-CM | POA: Insufficient documentation

## 2018-11-27 DIAGNOSIS — N939 Abnormal uterine and vaginal bleeding, unspecified: Secondary | ICD-10-CM | POA: Insufficient documentation

## 2018-11-27 LAB — URINALYSIS, ROUTINE W REFLEX MICROSCOPIC
BACTERIA UA: NONE SEEN
BILIRUBIN URINE: NEGATIVE
GLUCOSE, UA: NEGATIVE mg/dL
KETONES UR: NEGATIVE mg/dL
LEUKOCYTES UA: NEGATIVE
NITRITE: NEGATIVE
PH: 6 (ref 5.0–8.0)
PROTEIN: NEGATIVE mg/dL
Specific Gravity, Urine: 1.021 (ref 1.005–1.030)

## 2018-11-27 LAB — POCT PREGNANCY, URINE: Preg Test, Ur: NEGATIVE

## 2018-11-27 NOTE — MAU Provider Note (Signed)
Chief Complaint:  Vaginal Bleeding   First Provider Initiated Contact with Patient 11/27/18 1727      HPI: Amber Solis is a 23 y.o. [id number] who presents to maternity admissions reporting recurrent vaginal bleeding and urgency/dysuria.  States had menses 11/09/18 and then started bleeding again This week.  Also has urinary urgency and some dysuria.  . She reports vaginal bleeding, but no vaginal itching/burning, urinary symptoms, h/a, dizziness, n/v, or fever/chills.    Vaginal Bleeding  The patient's primary symptoms include pelvic pain and vaginal bleeding. The patient's pertinent negatives include no genital itching, genital lesions or genital odor. This is a recurrent problem. The current episode started 1 to 4 weeks ago. The problem occurs intermittently. The problem has been unchanged. The pain is mild. She is not pregnant. Associated symptoms include urgency. Pertinent negatives include no chills, fever, frequency, nausea or vomiting. The vaginal discharge was bloody. The vaginal bleeding is lighter than menses. She has not been passing clots. She has not been passing tissue. Nothing aggravates the symptoms. She has tried nothing for the symptoms. She uses nothing for contraception.   RN Note: LMP was 11/09/18 and had some vaginal bleeding again since Saturday. Had increased frequency with urination for about 3 weeks. Not on any birth control. No pain  Past Medical History: Past Medical History:  Diagnosis Date  . Anemia   . Chlamydia   . Ovarian cyst     Past obstetric history: OB History  Gravida Para Term Preterm AB Living  1       1    SAB TAB Ectopic Multiple Live Births    1          # Outcome Date GA Lbr Len/2nd Weight Sex Delivery Anes PTL Lv  1 TAB             Past Surgical History: Past Surgical History:  Procedure Laterality Date  . BREAST REDUCTION SURGERY    . WISDOM TOOTH EXTRACTION      Family History: No family history on file.  Social  History: Social History   Tobacco Use  . Smoking status: Never Smoker  . Smokeless tobacco: Never Used  Substance Use Topics  . Alcohol use: Yes    Comment: socially  . Drug use: Yes    Frequency: 7.0 times per week    Types: Marijuana    Comment: Last used 10/03/15    Allergies: No Known Allergies  Meds:  Medications Prior to Admission  Medication Sig Dispense Refill Last Dose  . benzonatate (TESSALON) 200 MG capsule Take 1 capsule (200 mg total) by mouth 3 (three) times daily as needed for cough. 30 capsule 1 More than a month at Unknown time  . cetirizine (ZYRTEC) 10 MG tablet Take 10 mg by mouth daily as needed for allergies.   More than a month at Unknown time  . ibuprofen (ADVIL,MOTRIN) 600 MG tablet Take 1 tablet (600 mg total) by mouth every 6 (six) hours as needed. 30 tablet 0   . methocarbamol (ROBAXIN) 500 MG tablet Take 1 tablet (500 mg total) by mouth at bedtime as needed for muscle spasms. 10 tablet 0   . ofloxacin (OCUFLOX) 0.3 % ophthalmic solution 1 drop in right eye every 4 hours for the first 2 days, 4 times a day for the next 5 days. 5 mL 0   . Polyethyl Glycol-Propyl Glycol (SYSTANE) 0.4-0.3 % GEL ophthalmic gel Place 1 application into both eyes at bedtime as needed. 1 Bottle  0   . predniSONE (DELTASONE) 50 MG tablet Take 1 tablet (50 mg total) by mouth daily. 3 tablet 0 More than a month at Unknown time    I have reviewed patient's Past Medical Hx, Surgical Hx, Family Hx, Social Hx, medications and allergies.  ROS:  Review of Systems  Constitutional: Negative for chills and fever.  Gastrointestinal: Negative for nausea and vomiting.  Genitourinary: Positive for pelvic pain, urgency and vaginal bleeding. Negative for frequency.   Other systems negative     Physical Exam   Patient Vitals for the past 24 hrs:  BP Temp Temp src Pulse Resp SpO2 Height Weight  11/27/18 1638 122/69 (!) 97.4 F (36.3 C) Oral 82 16 100 % 5\' 7"  (1.702 m) 106.1 kg    Constitutional: Well-developed, well-nourished female in no acute distress.  Cardiovascular: normal rate and rhythm Respiratory: normal effort, no distress.  GI: Abd soft, non-tender.  Nondistended.  No rebound, No guarding.  Bowel Sounds audible  MS: Extremities nontender, no edema, normal ROM Neurologic: Alert and oriented x 4.   Grossly nonfocal. GU: Neg CVAT. Skin:  Warm and Dry Psych:  Affect appropriate.  PELVIC EXAM: small to moderate vaginal bleeding. Declines pelvic exam    Labs: Results for orders placed or performed during the hospital encounter of 11/27/18 (from the past 24 hour(s))  Pregnancy, urine POC     Status: None   Collection Time: 11/27/18  4:47 PM  Result Value Ref Range   Preg Test, Ur NEGATIVE NEGATIVE  Urinalysis, Routine w reflex microscopic     Status: Abnormal   Collection Time: 11/27/18  4:49 PM  Result Value Ref Range   Color, Urine YELLOW YELLOW   APPearance CLEAR CLEAR   Specific Gravity, Urine 1.021 1.005 - 1.030   pH 6.0 5.0 - 8.0   Glucose, UA NEGATIVE NEGATIVE mg/dL   Hgb urine dipstick MODERATE (A) NEGATIVE   Bilirubin Urine NEGATIVE NEGATIVE   Ketones, ur NEGATIVE NEGATIVE mg/dL   Protein, ur NEGATIVE NEGATIVE mg/dL   Nitrite NEGATIVE NEGATIVE   Leukocytes, UA NEGATIVE NEGATIVE   RBC / HPF 0-5 0 - 5 RBC/hpf   WBC, UA 11-20 0 - 5 WBC/hpf   Bacteria, UA NONE SEEN NONE SEEN   Squamous Epithelial / LPF 0-5 0 - 5   Mucus PRESENT    Amorphous Crystal PRESENT       Imaging:  No results found.  MAU Course/MDM: I have ordered labs as follows: UPT and UA Discussed UA is clear, no indication of Urinary tract infection. Imaging ordered: none Results reviewed.   Discussed bleeding is likely anovulatory and will likely resolve over the next few cycles  Discussed urgency and dysuria may be bladder function related or possibly Caffeine irritation. If persists, needs Urology evaluation  Pt stable at time of  discharge.  Assessment: Abnormal uterine bleeding (AUB) - Plan: Discharge patient  Dysuria - Plan: Discharge patient   Plan: Discharge home Recommend Chart cycles and if this persists, would recommend testing in office  Recommend d/c caffeine and push water intake  Encouraged to return here or to other Urgent Care/ED if she develops worsening of symptoms, increase in pain, fever, or other concerning symptoms.   Wynelle BourgeoisMarie Williams CNM, MSN Certified Nurse-Midwife 11/27/2018 5:27 PM

## 2018-11-27 NOTE — Discharge Instructions (Signed)
Urinary Frequency, Adult Urinary frequency means urinating more often than usual. You may urinate every 1-2 hours even though you drink a normal amount of fluid and do not have a bladder infection or condition. Although you urinate more often than normal, the total amount of urine produced in a day is normal. With urinary frequency, you may have an urgent need to urinate often. The stress and anxiety of needing to find a bathroom quickly can make this urge worse. This condition may go away on its own or you may need treatment at home. Home treatment may include bladder training, exercises, taking medicines, or making changes to your diet. Follow these instructions at home: Bladder health  Keep a bladder diary if told by your health care provider. Keep track of: What you eat and drink. How often you urinate. How much you urinate. Follow a bladder training program if told by your health care provider. This may include: Learning to delay going to the bathroom. Double urinating (voiding). This helps if you are not completely emptying your bladder. Scheduled voiding. Do Kegel exercises as told by your health care provider. Kegel exercises strengthen the muscles that help control urination, which may help the condition. Eating and drinking If told by your health care provider, make diet changes, such as: Avoiding caffeine. Drinking fewer fluids, especially alcohol. Not drinking in the evening. Avoiding foods or drinks that may irritate the bladder. These include coffee, tea, soda, artificial sweeteners, citrus, tomato-based foods, and chocolate. Eating foods that help prevent or ease constipation. Constipation can make this condition worse. Your health care provider may recommend that you: Drink enough fluid to keep your urine pale yellow. Take over-the-counter or prescription medicines. Eat foods that are high in fiber, such as beans, whole grains, and fresh fruits and vegetables. Limit foods  that are high in fat and processed sugars, such as fried or sweet foods. General instructions Take over-the-counter and prescription medicines only as told by your health care provider. Keep all follow-up visits as told by your health care provider. This is important. Contact a health care provider if: You start urinating more often. You feel pain or irritation when you urinate. You notice blood in your urine. Your urine looks cloudy. You develop a fever. You begin vomiting. Get help right away if: You are unable to urinate. Summary Urinary frequency means urinating more often than usual. With urinary frequency, you may urinate every 1-2 hours even though you drink a normal amount of fluid and do not have a bladder infection or other bladder condition. Your health care provider may recommend that you keep a bladder diary, follow a bladder training program, or make dietary changes. If told by your health care provider, do Kegel exercises to strengthen the muscles that help control urination. Take over-the-counter and prescription medicines only as told by your health care provider. Contact a health care provider if your symptoms do not improve or get worse. This information is not intended to replace advice given to you by your health care provider. Make sure you discuss any questions you have with your health care provider. Document Released: 09/09/2009 Document Revised: 05/23/2018 Document Reviewed: 05/23/2018 Elsevier Interactive Patient Education  2019 Elsevier Inc. Dysfunctional Uterine Bleeding Dysfunctional uterine bleeding is abnormal bleeding from the uterus. Dysfunctional uterine bleeding includes:  A menstrual period that comes earlier or later than usual.  A menstrual period that is lighter or heavier than usual, or has large blood clots.  Vaginal bleeding between menstrual periods.  Skipping one or more menstrual periods.  Vaginal bleeding after sex.  Vaginal bleeding  after menopause. Follow these instructions at home: Eating and drinking   Eat well-balanced meals. Include foods that are high in iron, such as liver, meat, shellfish, green leafy vegetables, and eggs.  To prevent or treat constipation, your health care provider may recommend that you: ? Drink enough fluid to keep your urine pale yellow. ? Take over-the-counter or prescription medicines. ? Eat foods that are high in fiber, such as beans, whole grains, and fresh fruits and vegetables. ? Limit foods that are high in fat and processed sugars, such as fried or sweet foods. Medicines  Take over-the-counter and prescription medicines only as told by your health care provider.  Do not change medicines without talking with your health care provider.  Aspirin or medicines that contain aspirin may make the bleeding worse. Do not take those medicines: ? During the week before your menstrual period. ? During your menstrual period.  If you were prescribed iron pills, take them as told by your health care provider. Iron pills help to replace iron that your body loses because of this condition. Activity  If you need to change your sanitary pad or tampon more than one time every 2 hours: ? Lie in bed with your feet raised (elevated). ? Place a cold pack on your lower abdomen. ? Rest as much as possible until the bleeding stops or slows down.  Do not try to lose weight until the bleeding has stopped and your blood iron level is back to normal. General instructions   For two months, write down: ? When your menstrual period starts. ? When your menstrual period ends. ? When any abnormal vaginal bleeding occurs. ? What problems you notice.  Keep all follow up visits as told by your health care provider. This is important. Contact a health care provider if you:  Feel light-headed or weak.  Have nausea and vomiting.  Cannot eat or drink without vomiting.  Feel dizzy or have diarrhea while  you are taking medicines.  Are taking birth control pills or hormones, and you want to change them or stop taking them. Get help right away if:  You develop a fever or chills.  You need to change your sanitary pad or tampon more than one time per hour.  Your vaginal bleeding becomes heavier, or your flow contains clots more often.  You develop pain in your abdomen.  You lose consciousness.  You develop a rash. Summary  Dysfunctional uterine bleeding is abnormal bleeding from the uterus.  It includes menstrual bleeding of abnormal duration, volume, or regularity.  Bleeding after sex and after menopause are also considered dysfunctional uterine bleeding. This information is not intended to replace advice given to you by your health care provider. Make sure you discuss any questions you have with your health care provider. Document Released: 11/10/2000 Document Revised: 04/24/2018 Document Reviewed: 04/24/2018 Elsevier Interactive Patient Education  2019 [employer]

## 2018-11-27 NOTE — MAU Note (Signed)
LMP was 11/09/18 and had some vaginal bleeding again since Saturday. Had increased frequency with urination for about 3 weeks. Not on any birth control. No pain

## 2018-12-11 ENCOUNTER — Ambulatory Visit: Payer: No Typology Code available for payment source | Admitting: Gastroenterology

## 2018-12-11 ENCOUNTER — Encounter: Payer: Self-pay | Admitting: Gastroenterology

## 2018-12-11 ENCOUNTER — Other Ambulatory Visit (INDEPENDENT_AMBULATORY_CARE_PROVIDER_SITE_OTHER): Payer: No Typology Code available for payment source

## 2018-12-11 VITALS — BP 110/80 | HR 76 | Ht 67.0 in | Wt 231.8 lb

## 2018-12-11 DIAGNOSIS — R197 Diarrhea, unspecified: Secondary | ICD-10-CM

## 2018-12-11 DIAGNOSIS — R110 Nausea: Secondary | ICD-10-CM

## 2018-12-11 LAB — SEDIMENTATION RATE: Sed Rate: 33 mm/hr — ABNORMAL HIGH (ref 0–20)

## 2018-12-11 LAB — C-REACTIVE PROTEIN: CRP: 1 mg/dL (ref 0.5–20.0)

## 2018-12-11 NOTE — Patient Instructions (Signed)
Diet for Irritable Bowel Syndrome  When you have irritable bowel syndrome (IBS), it is very important to eat the foods and follow the eating habits that are best for your condition. IBS may cause various symptoms such as pain in the abdomen, constipation, or diarrhea. Choosing the right foods can help to ease the discomfort from these symptoms. Work with your health care provider and diet and nutrition specialist (dietitian) to find the eating plan that will help to control your symptoms.  What are tips for following this plan?         · Keep a food diary. This will help you identify foods that cause symptoms. Write down:  ? What you eat and when you eat it.  ? What symptoms you have.  ? When symptoms occur in relation to your meals, such as "pain in abdomen 2 hours after dinner."  · Eat your meals slowly and in a relaxed setting.  · Aim to eat 5-6 small meals per day. Do not skip meals.  · Drink enough fluid to keep your urine pale yellow.  · Ask your health care provider if you should take an over-the-counter probiotic to help restore healthy bacteria in your gut (digestive tract).  ? Probiotics are foods that contain good bacteria and yeasts.  · Your dietitian may have specific dietary recommendations for you based on your symptoms. He or she may recommend that you:  ? Avoid foods that cause symptoms. Talk with your dietitian about other ways to get the same nutrients that are in those problem foods.  ? Avoid foods with gluten. Gluten is a protein that is found in rye, wheat, and barley.  ? Eat more foods that contain soluble fiber. Examples of foods with high soluble fiber include oats, seeds, and certain fruits and vegetables. Take a fiber supplement if directed by your dietitian.  ? Reduce or avoid certain foods called FODMAPs. These are foods that contain carbohydrates that are hard to digest. Ask your doctor which foods contain these carbohydrates.  What foods are not recommended?  The following are some  foods and drinks that may make your symptoms worse:  · Fatty foods, such as french fries.  · Foods that contain gluten, such as pasta and cereal.  · Dairy products, such as milk, cheese, and ice cream.  · Chocolate.  · Alcohol.  · Products with caffeine, such as coffee.  · Carbonated drinks, such as soda.  · Foods that are high in FODMAPs. These include certain fruits and vegetables.  · Products with sweeteners such as honey, high fructose corn syrup, sorbitol, and mannitol.  The items listed above may not be a complete list of foods and beverages you should avoid. Contact a dietitian for more information.  What foods are good sources of fiber?  Your health care provider or dietitian may recommend that you eat more foods that contain fiber. Fiber can help to reduce constipation and other IBS symptoms. Add foods with fiber to your diet a little at a time so your body can get used to them. Too much fiber at one time might cause gas and swelling of your abdomen. The following are some foods that are good sources of fiber:  · Berries, such as raspberries, strawberries, and blueberries.  · Tomatoes.  · Carrots.  · Tameron Lama rice.  · Oats.  · Seeds, such as chia and pumpkin seeds.  The items listed above may not be a complete list of recommended sources of fiber. Contact   your dietitian for more options.  Where to find more information  · International Foundation for Functional Gastrointestinal Disorders: www.iffgd.org  · National Institute of Diabetes and Digestive and Kidney Diseases: www.niddk.nih.gov  Summary  · When you have irritable bowel syndrome (IBS), it is very important to eat the foods and follow the eating habits that are best for your condition.  · IBS may cause various symptoms such as pain in the abdomen, constipation, or diarrhea.  · Choosing the right foods can help to ease the discomfort that comes from symptoms.  · Keep a food diary. This will help you identify foods that cause symptoms.  · Your health  care provider or diet and nutrition specialist (dietitian) may recommend that you eat more foods that contain fiber.  This information is not intended to replace advice given to you by your health care provider. Make sure you discuss any questions you have with your health care provider.  Document Released: 02/03/2004 Document Revised: 06/10/2018 Document Reviewed: 07/17/2017  Elsevier Interactive Patient Education © 2019 Elsevier Inc.

## 2018-12-11 NOTE — Progress Notes (Signed)
Referring Provider: No ref. provider found Primary Care Physician:  Patient, No Pcp Per   Reason for Consultation: Diarrhea and nausea   IMPRESSION:  Acute symptoms of diarrhea with associated nausea and bloating Psychosocial stress  Acute symptoms may be viral illness that has now resolved. If symptoms return and/or persist, would need to consider alternative diagnoses. She does not meet criteria for IBS at this time due to lack of chronicity.  PLAN: Fecal calprotectin, ESR, CRP to screen for inflammatory bowel disease Trial of IBGuard to be used PRN (samples provided) IBS brochure/diet provided RTC clinic PRN    HPI: Amber Solis is a 23 y.o. female self-referred for diarrhea and nausea. The history is obtained through the patient.   One month ago she had severe diarrhea. Remembers sitting on the toilet for one hour. Diarrhea ultimately lasted for 3 days.  Followed by 2 days of constipation. Stools appeared oily and very foul smelling. Three weeks prior to that episode she had daily nausea without vomiting. Mild lower abdominal cramping. Frequent dull, pounding headaches.  Associated gas and bloating.  Temporally associated with stress as the symptoms developed on the day of her graduation from [employer] with a degree in [place] while she was dealing with work and multiple family members. 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 Weight stable. Appetite good. No extraGI manifestations of IBD. No identified exacerbating or relieving features.   No history of similar symptoms. Symptoms have resolved and she has felt well since. She may have some intermitting bloating. BM have returned to normal. One formed BM daily. No further nausea.   She felt the symptoms were unusual for her and called to make this appointment. She researched on [employer] and became concerned about the possibility of IBS or IBD.   No prior endoscopic evaluation.  No prior abdominal  imaging.  No recent labs or stool studies.   There is no family history of autoimmune disease, colon cancer, or colon polyps.   Past Medical History:  Diagnosis Date  . Anemia   . Chlamydia   . Ovarian cyst     Past Surgical History:  Procedure Laterality Date  . BREAST REDUCTION SURGERY    . WISDOM TOOTH EXTRACTION      No current outpatient medications on file.   No current facility-administered medications for this visit.     Allergies as of 12/11/2018  . (No Known Allergies)    No family history on file.  Social History   Socioeconomic History  . Marital status: Single    Spouse name: Not on file  . Number of children: Not on file  . Years of education: Not on file  . Highest education level: Not on file  Occupational History  . Not on file  Social Needs  . Financial resource strain: Not on file  . Food insecurity:    Worry: Not on file    Inability: Not on file  . Transportation needs:    Medical: Not on file    Non-medical: Not on file  Tobacco Use  . Smoking status: Never Smoker  . Smokeless tobacco: Never Used  Substance and Sexual Activity  . Alcohol use: Yes    Comment: socially  . Drug use: Yes    Frequency: 7.0 times per week    Types: Marijuana    Comment: Last used 10/03/15  . Sexual activity: Yes    Birth control/protection: None  Lifestyle  . Physical  activity:    Days per week: Not on file    Minutes per session: Not on file  . Stress: Not on file  Relationships  . Social connections:    Talks on phone: Not on file    Gets together: Not on file    Attends religious service: Not on file    Active member of club or organization: Not on file    Attends meetings of clubs or organizations: Not on file    Relationship status: Not on file  . Intimate partner violence:    Fear of current or ex partner: Not on file    Emotionally abused: Not on file    Physically abused: Not on file    Forced sexual activity: Not on file  Other Topics  Concern  . Not on file  Social History Narrative  . Not on file    Review of Systems: 12 system ROS is negative except as noted above with the additions of anxiety, back pain, depression, and insomnia.  Filed Weights   12/11/18 0937  Weight: 231 lb 12.8 oz (105.1 kg)    Physical Exam: Vital signs were reviewed. General:   Alert, well-nourished, pleasant and cooperative in NAD Head:  Normocephalic and atraumatic. Eyes:  Sclera clear, no icterus.   Conjunctiva pink. Mouth:  No deformity or lesions.   Neck:  Supple; no thyromegaly. Lungs:  Clear throughout to auscultation.   No wheezes.  Heart:  Regular rate and rhythm; no murmurs Abdomen:  Soft, nontender, normal bowel sounds. No rebound or guarding. No hepatosplenomegaly Rectal:  Deferred  Msk:  Symmetrical without gross deformities. Extremities:  No gross deformities or edema. Neurologic:  Alert and  oriented x4;  grossly nonfocal Skin:  No rash or bruise. Psych:  Alert and cooperative. Normal mood and affect.   Maliah Pyles L. Tarri Glenn, MD, MPH [facility] 12/11/2018, 3:17 PM

## 2018-12-26 ENCOUNTER — Encounter (HOSPITAL_COMMUNITY): Payer: Self-pay

## 2018-12-26 ENCOUNTER — Emergency Department (HOSPITAL_COMMUNITY)
Admission: EM | Admit: 2018-12-26 | Discharge: 2018-12-26 | Disposition: A | Discharge status: Home / Self Care | Payer: BLUE CROSS/BLUE SHIELD | Attending: Emergency Medicine | Admitting: Emergency Medicine

## 2018-12-26 ENCOUNTER — Other Ambulatory Visit: Payer: Self-pay

## 2018-12-26 DIAGNOSIS — Z5321 Procedure and treatment not carried out due to patient leaving prior to being seen by health care provider: Secondary | ICD-10-CM | POA: Insufficient documentation

## 2018-12-26 DIAGNOSIS — R5383 Other fatigue: Secondary | ICD-10-CM | POA: Diagnosis not present

## 2018-12-26 LAB — CBC
HCT: 30.9 % — ABNORMAL LOW (ref 36.0–46.0)
Hemoglobin: 9.3 g/dL — ABNORMAL LOW (ref 12.0–15.0)
MCH: 26.6 pg (ref 26.0–34.0)
MCHC: 30.1 g/dL (ref 30.0–36.0)
MCV: 88.5 fL (ref 80.0–100.0)
Platelets: 545 10*3/uL — ABNORMAL HIGH (ref 150–400)
RBC: 3.49 MIL/uL — ABNORMAL LOW (ref 3.87–5.11)
RDW: 13.3 % (ref 11.5–15.5)
WBC: 12.2 10*3/uL — ABNORMAL HIGH (ref 4.0–10.5)
nRBC: 0 % (ref 0.0–0.2)

## 2018-12-26 LAB — I-STAT BETA HCG BLOOD, ED (MC, WL, AP ONLY): I-stat hCG, quantitative: <5 m[IU]/mL (ref <5)

## 2018-12-26 LAB — BASIC METABOLIC PANEL
Anion gap: 8 (ref 5–15)
BUN: 20 mg/dL (ref 6–20)
CO2: 23 mmol/L (ref 22–32)
CREATININE: 0.68 mg/dL (ref 0.44–1.00)
Calcium: 9.2 mg/dL (ref 8.9–10.3)
Chloride: 106 mmol/L (ref 98–111)
GFR calc Af Amer: >60 mL/min (ref >60)
GFR calc non Af Amer: >60 mL/min (ref >60)
Glucose, Bld: 95 mg/dL (ref 70–99)
Potassium: 4.3 mmol/L (ref 3.5–5.1)
Sodium: 137 mmol/L (ref 135–145)

## 2018-12-26 MED ORDER — SODIUM CHLORIDE 0.9% FLUSH: 3.0000 mL | Freq: Once | INTRAVENOUS | Status: DC

## 2018-12-26 NOTE — ED Triage Notes (Signed)
Patient states that she had a right femur fracture surgery last week. Patient c/o feeling fatigued that started today. Patient states, "I slept all day and did not eat." patient states she had a blood transfusion last week.

## 2021-02-12 ENCOUNTER — Emergency Department (HOSPITAL_COMMUNITY)
Admission: EM | Admit: 2021-02-12 | Discharge: 2021-02-12 | Disposition: A | Discharge status: Home / Self Care | Payer: BC Managed Care – PPO | Attending: Emergency Medicine | Admitting: Emergency Medicine

## 2021-02-12 ENCOUNTER — Emergency Department (HOSPITAL_COMMUNITY): Payer: BC Managed Care – PPO

## 2021-02-12 ENCOUNTER — Other Ambulatory Visit: Payer: Self-pay

## 2021-02-12 DIAGNOSIS — R102 Pelvic and perineal pain: Secondary | ICD-10-CM | POA: Diagnosis present

## 2021-02-12 DIAGNOSIS — R000 Tachycardia, unspecified: Secondary | ICD-10-CM | POA: Diagnosis not present

## 2021-02-12 DIAGNOSIS — R1031 Right lower quadrant pain: Secondary | ICD-10-CM

## 2021-02-12 DIAGNOSIS — N83201 Unspecified ovarian cyst, right side: Secondary | ICD-10-CM | POA: Insufficient documentation

## 2021-02-12 LAB — I-STAT CHEM 8, ED
BUN: 18 mg/dL (ref 6–20)
Calcium, Ion: 1.13 mmol/L — ABNORMAL LOW (ref 1.15–1.40)
Chloride: 109 mmol/L (ref 98–111)
Creatinine, Ser: 0.7 mg/dL (ref 0.44–1.00)
Glucose, Bld: 94 mg/dL (ref 70–99)
HCT: 31 % — ABNORMAL LOW (ref 36.0–46.0)
Hemoglobin: 10.5 g/dL — ABNORMAL LOW (ref 12.0–15.0)
Potassium: 3.7 mmol/L (ref 3.5–5.1)
Sodium: 140 mmol/L (ref 135–145)
TCO2: 22 mmol/L (ref 22–32)

## 2021-02-12 LAB — URINALYSIS, ROUTINE W REFLEX MICROSCOPIC
Bilirubin Urine: NEGATIVE
Glucose, UA: NEGATIVE mg/dL
Hgb urine dipstick: NEGATIVE
Ketones, ur: 5 mg/dL — AB
Leukocytes,Ua: NEGATIVE
Nitrite: NEGATIVE
Protein, ur: NEGATIVE mg/dL
Specific Gravity, Urine: 1.029 (ref 1.005–1.030)
pH: 5 (ref 5.0–8.0)

## 2021-02-12 LAB — I-STAT BETA HCG BLOOD, ED (MC, WL, AP ONLY): I-stat hCG, quantitative: <5 m[IU]/mL (ref <5)

## 2021-02-12 LAB — WET PREP, GENITAL
Clue Cells Wet Prep HPF POC: NONE SEEN
Sperm: NONE SEEN
Trich, Wet Prep: NONE SEEN
WBC, Wet Prep HPF POC: NONE SEEN
Yeast Wet Prep HPF POC: NONE SEEN

## 2021-02-12 MED ORDER — ONDANSETRON HCL 4 MG/2ML IJ SOLN
4.0000 mg | Freq: Once | INTRAMUSCULAR | Status: AC
  Administered 2021-02-12: 4 mg via INTRAVENOUS
  Filled 2021-02-12: qty 2

## 2021-02-12 MED ORDER — SODIUM CHLORIDE 0.9 % IV BOLUS
500.0000 mL | Freq: Once | INTRAVENOUS | Status: AC
  Administered 2021-02-12: 500 mL via INTRAVENOUS

## 2021-02-12 MED ORDER — MORPHINE SULFATE (PF) 4 MG/ML IV SOLN
4.0000 mg | Freq: Once | INTRAVENOUS | Status: AC
  Administered 2021-02-12: 4 mg via INTRAVENOUS
  Filled 2021-02-12: qty 1

## 2021-02-12 MED ORDER — OXYCODONE-ACETAMINOPHEN 5-325 MG PO TABS
2.0000 | ORAL_TABLET | Freq: Once | ORAL | Status: AC
  Administered 2021-02-12: 2 via ORAL
  Filled 2021-02-12: qty 2

## 2021-02-12 MED ORDER — KETOROLAC TROMETHAMINE 15 MG/ML IJ SOLN
15.0000 mg | Freq: Once | INTRAMUSCULAR | Status: AC
  Administered 2021-02-12: 15 mg via INTRAVENOUS
  Filled 2021-02-12: qty 1

## 2021-02-12 MED ORDER — NAPROXEN 500 MG PO TABS: 500.0000 mg | ORAL_TABLET | Freq: Two times a day (BID) | ORAL | 0 refills | Status: AC

## 2021-02-12 NOTE — ED Provider Notes (Signed)
[facility]-EMERGENCY DEPT Provider Note   CSN: [id number] Arrival date & time: 02/12/21  0230     History Chief Complaint  Patient presents with  . Pelvic Pain    Amber Solis is a 25 y.o. female.  25 year old female presents to the emergency department for sudden onset right sided pelvic pain.  Symptoms began around midnight after intercourse.  Pain has remained constant.  It radiates from her right lower abdomen around to her right low back.  She took a muscle relaxer for symptoms without improvement.  She is feeling a bit nauseated, but denies any vomiting.  No recent fevers, hematuria, vaginal bleeding, vaginal discharge, bowel changes. No hx of abdominal surgeries.    Pelvic Pain       Past Medical History:  Diagnosis Date  . Anemia   . Chlamydia   . Ovarian cyst     There are no problems to display for this patient.   Past Surgical History:  Procedure Laterality Date  . BREAST REDUCTION SURGERY    . FRACTURE SURGERY    . WISDOM TOOTH EXTRACTION       OB History    Gravida  1   Para      Term      Preterm      AB  1   Living        SAB      IAB  1   Ectopic      Multiple      Live Births              No family history on file.  Social History   Tobacco Use  . Smoking status: Never Smoker  . Smokeless tobacco: Never Used  Vaping Use  . Vaping Use: Never used  Substance Use Topics  . Alcohol use: Yes    Comment: socially  . Drug use: Yes    Frequency: 7.0 times per week    Types: Marijuana    Home Medications Prior to Admission medications   Medication Sig Start Date End Date Taking? Authorizing Provider  naproxen (NAPROSYN) 500 MG tablet Take 1 tablet (500 mg total) by mouth 2 (two) times daily. 02/12/21  Yes Antony Madura, PA-C  Probiotic Product (ALIGN PO) Take 1 capsule by mouth daily.   Yes [provider]    Allergies    Patient has no known allergies.  Review of Systems   Review  of Systems  Genitourinary: Positive for pelvic pain.  Ten systems reviewed and are negative for acute change, except as noted in the HPI.    Physical Exam Updated Vital Signs BP 113/81   Pulse 86   Temp 98.1 F (36.7 C) (Oral)   Resp 17   Ht 5\' 7"  (1.702 m)   Wt 108 kg   LMP 01/22/2021 (Exact Date)   SpO2 98%   BMI 37.28 kg/m   Physical Exam Vitals and nursing note reviewed.  Constitutional:      General: She is not in acute distress.    Appearance: She is well-developed. She is not diaphoretic.     Comments: Appears quite uncomfortable, but nontoxic  HENT:     Head: Normocephalic and atraumatic.  Eyes:     General: No scleral icterus.    Conjunctiva/sclera: Conjunctivae normal.  Cardiovascular:     Rate and Rhythm: Regular rhythm. Tachycardia present.     Pulses: Normal pulses.  Pulmonary:     Effort: Pulmonary effort is normal. No  respiratory distress.     Comments: Respirations even and unlabored Abdominal:     Palpations: Abdomen is soft. There is no mass.     Tenderness: There is abdominal tenderness. There is guarding.     Comments: Right lower quadrant and suprapubic tenderness with voluntary guarding.  Abdomen soft, obese.  No peritoneal signs.  Musculoskeletal:        General: Normal range of motion.     Cervical back: Normal range of motion.  Skin:    General: Skin is warm and dry.     Coloration: Skin is not pale.     Findings: No erythema or rash.  Neurological:     Mental Status: She is alert and oriented to person, place, and time.     Coordination: Coordination normal.  Psychiatric:        Behavior: Behavior normal.     ED Results / Procedures / Treatments   Labs (all labs ordered are listed, but only abnormal results are displayed) Labs Reviewed  URINALYSIS, ROUTINE W REFLEX MICROSCOPIC - Abnormal; Notable for the following components:      Result Value   Ketones, ur 5 (*)    All other components within normal limits  I-STAT CHEM 8, ED -  Abnormal; Notable for the following components:   Calcium, Ion 1.13 (*)    Hemoglobin 10.5 (*)    HCT 31.0 (*)    All other components within normal limits  WET PREP, GENITAL  I-STAT BETA HCG BLOOD, ED ([facility], [facility], [facility] ONLY)  GC/CHLAMYDIA PROBE AMP ([place]) NOT AT [facility]    EKG None  Radiology US PELVIC DOPPLER (TORSION R/O OR MASS ARTERIAL FLOW)  Result Date: 02/12/2021 CLINICAL DATA:  Initial evaluation for acute right lower quadrant pain. EXAM: TRANSABDOMINAL AND TRANSVAGINAL ULTRASOUND OF PELVIS DOPPLER ULTRASOUND OF OVARIES TECHNIQUE: Both transabdominal and transvaginal ultrasound examinations of the pelvis were performed. Transabdominal technique was performed for global imaging of the pelvis including uterus, ovaries, adnexal regions, and pelvic cul-de-sac. It was necessary to proceed with endovaginal exam following the transabdominal exam to visualize the uterus, endometrium, and ovaries. Color and duplex Doppler ultrasound was utilized to evaluate blood flow to the ovaries. COMPARISON:  None. FINDINGS: Uterus Measurements: 7 4 x 4.0 x 4.5 cm = volume: 69.5 mL. No fibroids or other mass visualized. Endometrium Thickness: 10 mm.  No focal abnormality visualized. Right ovary Measurements: 4.3 x 3.2 x 3.7 cm = volume: 26.6 mL. 2.3 x 2.1 x 2.4 cm complex cyst with peripherally increased vascularity, most characteristic of a degenerating corpus luteal cyst. Left ovary Measurements: 3.8 x 2.9 x 2.7 cm = volume: 15.3 mL. Normal appearance/no adnexal mass. Pulsed Doppler evaluation of both ovaries demonstrates normal low-resistance arterial and venous waveforms. Other findings Small volume free fluid seen within the right adnexa. IMPRESSION: 1. 2.4 cm complex right ovarian cyst, most characteristic of a degenerating corpus luteal cyst. Associated small volume free physiologic fluid within the right adnexa. Findings could contribute to acute right lower quadrant pain. 2. No evidence for torsion or  other acute abnormality. Electronically Signed   By: Rise Mu M.D.   On: 02/12/2021 04:48   US PELVIC COMPLETE WITH TRANSVAGINAL  Result Date: 02/12/2021 CLINICAL DATA:  Initial evaluation for acute right lower quadrant pain. EXAM: TRANSABDOMINAL AND TRANSVAGINAL ULTRASOUND OF PELVIS DOPPLER ULTRASOUND OF OVARIES TECHNIQUE: Both transabdominal and transvaginal ultrasound examinations of the pelvis were performed. Transabdominal technique was performed for global imaging of the pelvis including uterus, ovaries,  adnexal regions, and pelvic cul-de-sac. It was necessary to proceed with endovaginal exam following the transabdominal exam to visualize the uterus, endometrium, and ovaries. Color and duplex Doppler ultrasound was utilized to evaluate blood flow to the ovaries. COMPARISON:  None. FINDINGS: Uterus Measurements: 7 4 x 4.0 x 4.5 cm = volume: 69.5 mL. No fibroids or other mass visualized. Endometrium Thickness: 10 mm.  No focal abnormality visualized. Right ovary Measurements: 4.3 x 3.2 x 3.7 cm = volume: 26.6 mL. 2.3 x 2.1 x 2.4 cm complex cyst with peripherally increased vascularity, most characteristic of a degenerating corpus luteal cyst. Left ovary Measurements: 3.8 x 2.9 x 2.7 cm = volume: 15.3 mL. Normal appearance/no adnexal mass. Pulsed Doppler evaluation of both ovaries demonstrates normal low-resistance arterial and venous waveforms. Other findings Small volume free fluid seen within the right adnexa. IMPRESSION: 1. 2.4 cm complex right ovarian cyst, most characteristic of a degenerating corpus luteal cyst. Associated small volume free physiologic fluid within the right adnexa. Findings could contribute to acute right lower quadrant pain. 2. No evidence for torsion or other acute abnormality. Electronically Signed   By: Rise Mu M.D.   On: 02/12/2021 04:48    Procedures Procedures   Medications Ordered in ED Medications  morphine 4 MG/ML injection 4 mg (4 mg  Intravenous Given 02/12/21 0344)  ondansetron (ZOFRAN) injection 4 mg (4 mg Intravenous Given 02/12/21 0346)  sodium chloride 0.9 % bolus 500 mL (0 mLs Intravenous Stopped 02/12/21 0456)  ketorolac (TORADOL) 15 MG/ML injection 15 mg (15 mg Intravenous Given 02/12/21 0456)  oxyCODONE-acetaminophen (PERCOCET/ROXICET) 5-325 MG per tablet 2 tablet (2 tablets Oral Given 02/12/21 0547)    ED Course  I have reviewed the triage vital signs and the nursing notes.  Pertinent labs & imaging results that were available during my care of the patient were reviewed by me and considered in my medical decision making (see chart for details).    MDM Rules/Calculators/A&P                          25 year old female presents to the emergency department for evaluation of sudden onset right lower quadrant abdominal pain which began after intercourse.  Symptoms started around midnight.  She has had good pain control with morphine, Toradol.  Evaluation in the ED consistent with complex right ovarian cyst.  No evidence of torsion.  Plan for continued outpatient supportive management with naproxen.  She has been encouraged to follow-up with her OB/GYN if symptoms persist.  Return precautions discussed and provided. Patient discharged in stable condition with no unaddressed concerns.   Final Clinical Impression(s) / ED Diagnoses Final diagnoses:  RLQ abdominal pain  Right ovarian cyst    Rx / DC Orders ED Discharge Orders         Ordered    naproxen (NAPROSYN) 500 MG tablet  2 times daily        02/12/21 0554           Antony Madura, PA-C 02/12/21 3154    Molpus, Jonny Ruiz, MD 02/12/21 [phone]

## 2021-02-12 NOTE — ED Triage Notes (Signed)
Pt c/o sudden onset of R sided pelvic pain after intercourse around midnight. States it radiates around to R flank. Hx ovarian cysts and not currently on any birth control

## 2021-02-14 LAB — GC/CHLAMYDIA PROBE AMP (~~LOC~~) NOT AT ARMC
Chlamydia: NEGATIVE
Comment: NEGATIVE
Comment: NORMAL
Neisseria Gonorrhea: NEGATIVE

## 2021-12-03 IMAGING — US US ART/VEN ABD/PELV/SCROTUM DOPPLER LTD
1 series · 13 of 25 positions shown · non-contrast
Comparison: None.

CLINICAL DATA: Initial evaluation for acute right lower quadrant
pain.

EXAM:
TRANSABDOMINAL AND TRANSVAGINAL ULTRASOUND OF PELVIS
DOPPLER ULTRASOUND OF OVARIES
TECHNIQUE: Both transabdominal and transvaginal ultrasound examinations of the
pelvis were performed. Transabdominal technique was performed for
global imaging of the pelvis including uterus, ovaries, adnexal
regions, and pelvic cul-de-sac.
It was necessary to proceed with endovaginal exam following the
transabdominal exam to visualize the uterus, endometrium, and
ovaries. Color and duplex Doppler ultrasound was utilized to
evaluate blood flow to the ovaries.

[Series 1: us art/ven abd/pelv/scrotum doppler ltd · 13 of 111 slices shown]
[im 1/111]
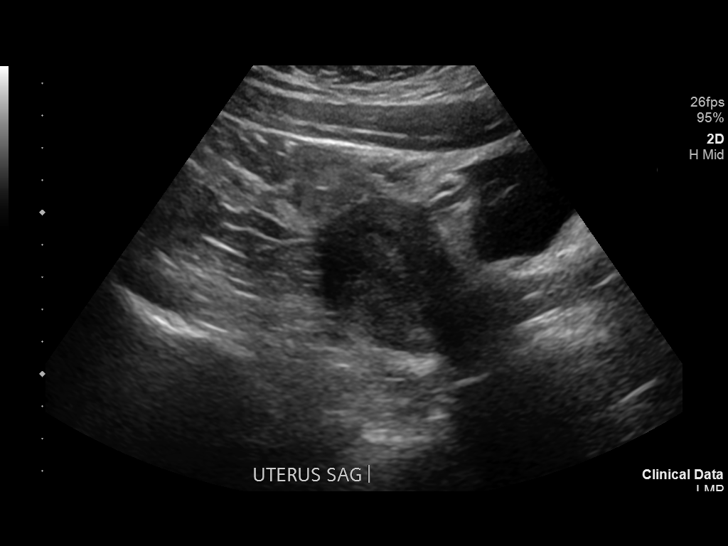
[im 10/111]
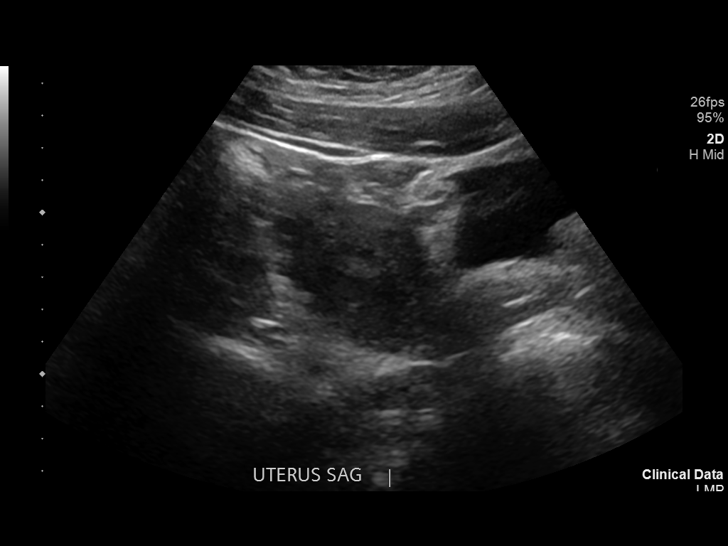
[im 19/111]
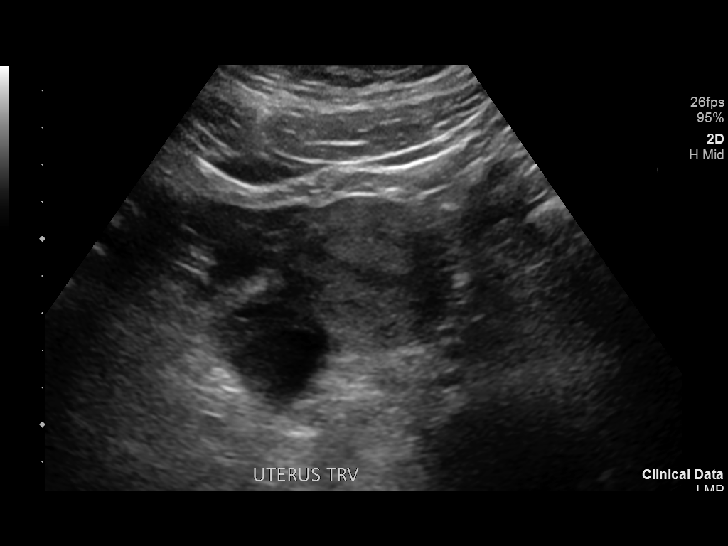
[im 28/111]
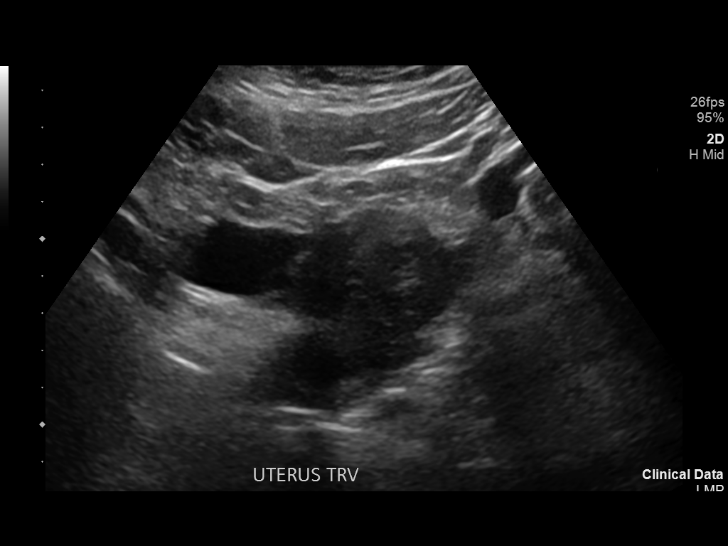
[im 37/111]
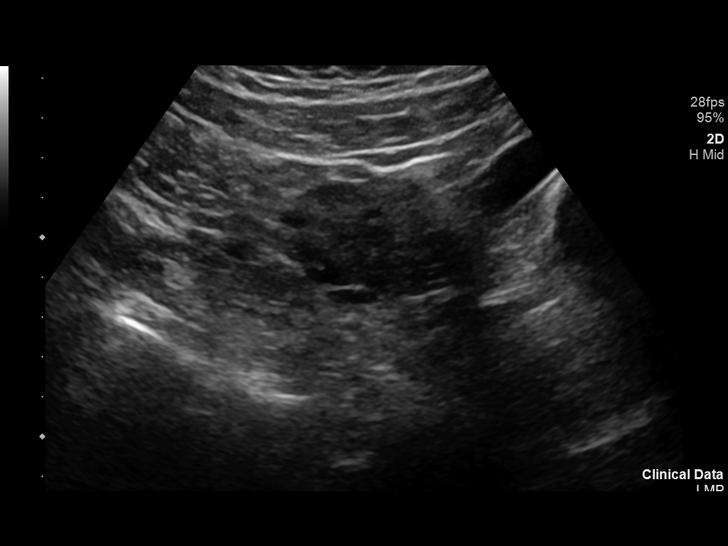
[im 46/111]
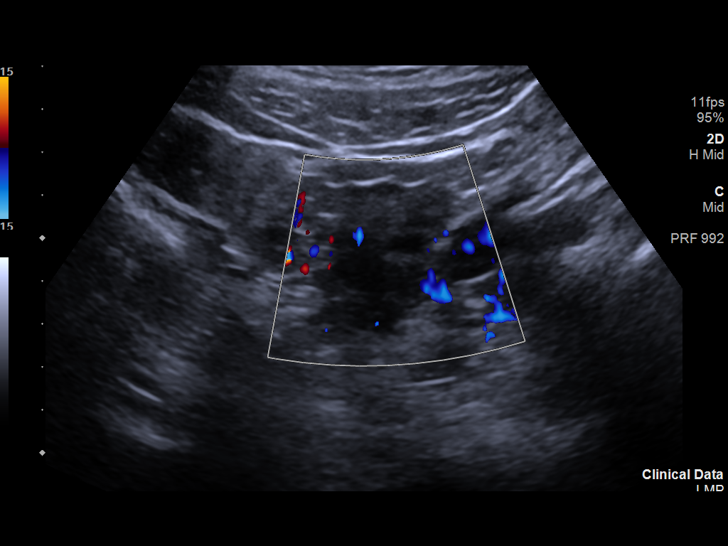
[im 56/111]
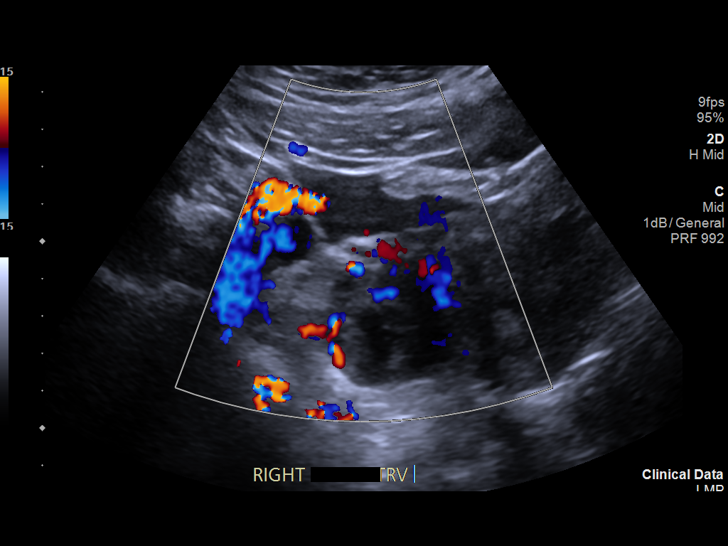
[im 65/111]
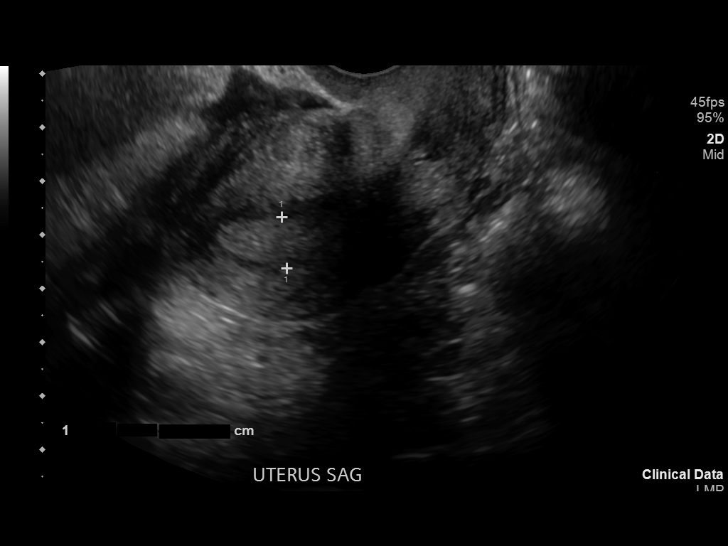
[im 74/111]
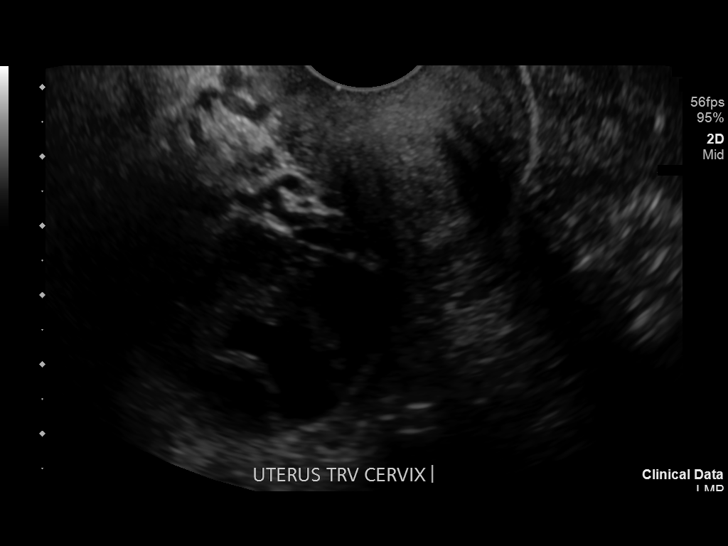
[im 83/111]
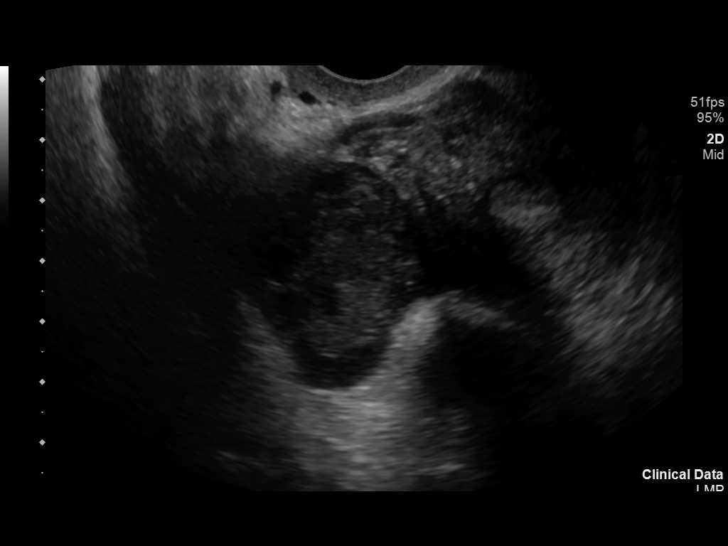
[im 92/111]
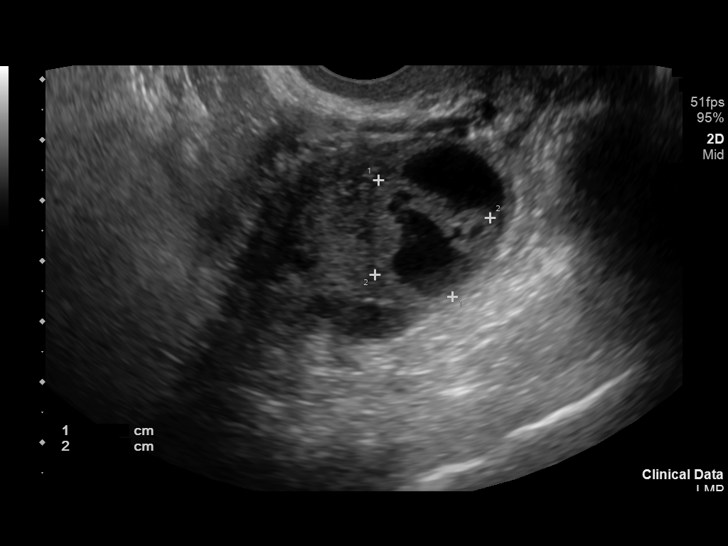
[im 101/111]
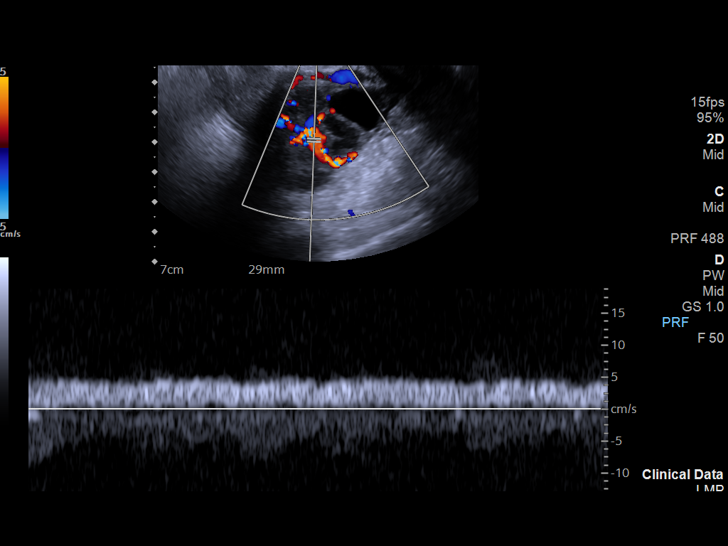
[im 111/111]
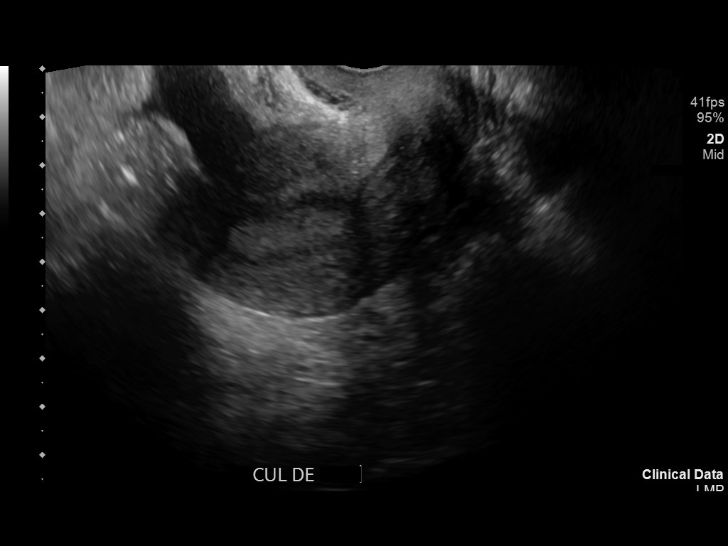

[13 of 25 positions shown; findings below may reference images not displayed]

FINDINGS: Uterus

Measurements: 7 4 x 4.0 x 4.5 cm = volume: 69.5 mL. No fibroids or
other mass visualized.

Endometrium

Thickness: 10 mm.  No focal abnormality visualized.

Right ovary

Measurements: 4.3 x 3.2 x 3.7 cm = volume: 26.6 mL. 2.3 x 2.1 x
cm complex cyst with peripherally increased vascularity, most
characteristic of a degenerating corpus luteal cyst.

Left ovary

Measurements: 3.8 x 2.9 x 2.7 cm = volume: 15.3 mL. Normal
appearance/no adnexal mass.

Pulsed Doppler evaluation of both ovaries demonstrates normal
low-resistance arterial and venous waveforms.

Other findings

Small volume free fluid seen within the right adnexa.
IMPRESSION: 1. 2.4 cm complex right ovarian cyst, most characteristic of a
degenerating corpus luteal cyst. Associated small volume free
physiologic fluid within the right adnexa. Findings could contribute
to acute right lower quadrant pain.
2. No evidence for torsion or other acute abnormality.

## 2021-12-03 IMAGING — US US PELVIS COMPLETE WITH TRANSVAGINAL
1 series · 13 of 25 positions shown · non-contrast
Comparison: None.

CLINICAL DATA: Initial evaluation for acute right lower quadrant
pain.

EXAM:
TRANSABDOMINAL AND TRANSVAGINAL ULTRASOUND OF PELVIS
DOPPLER ULTRASOUND OF OVARIES
TECHNIQUE: Both transabdominal and transvaginal ultrasound examinations of the
pelvis were performed. Transabdominal technique was performed for
global imaging of the pelvis including uterus, ovaries, adnexal
regions, and pelvic cul-de-sac.
It was necessary to proceed with endovaginal exam following the
transabdominal exam to visualize the uterus, endometrium, and
ovaries. Color and duplex Doppler ultrasound was utilized to
evaluate blood flow to the ovaries.

[Series 1: us pelvis complete with transvaginal · 13 of 111 slices shown]
[im 1/111]
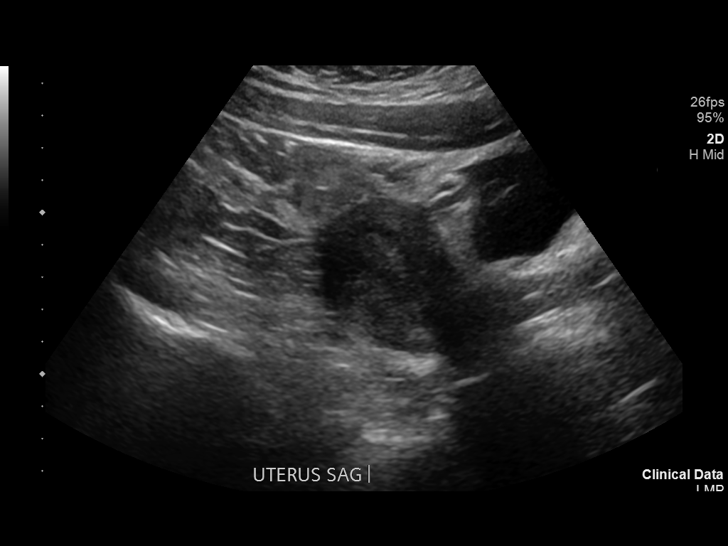
[im 10/111]
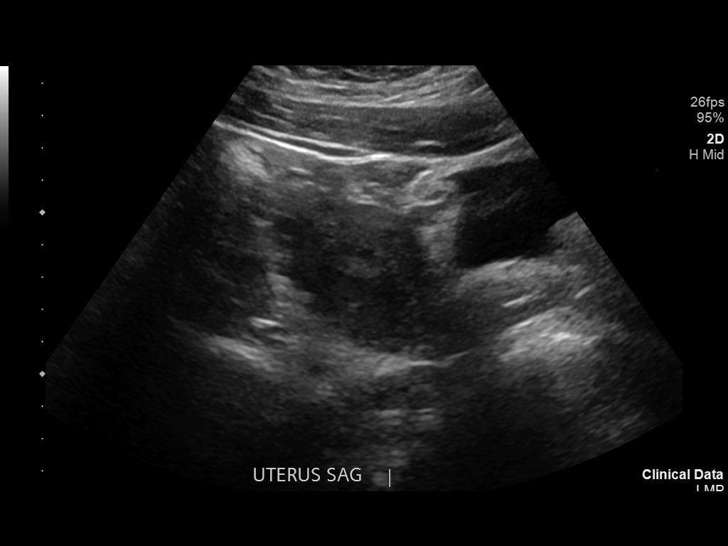
[im 19/111]
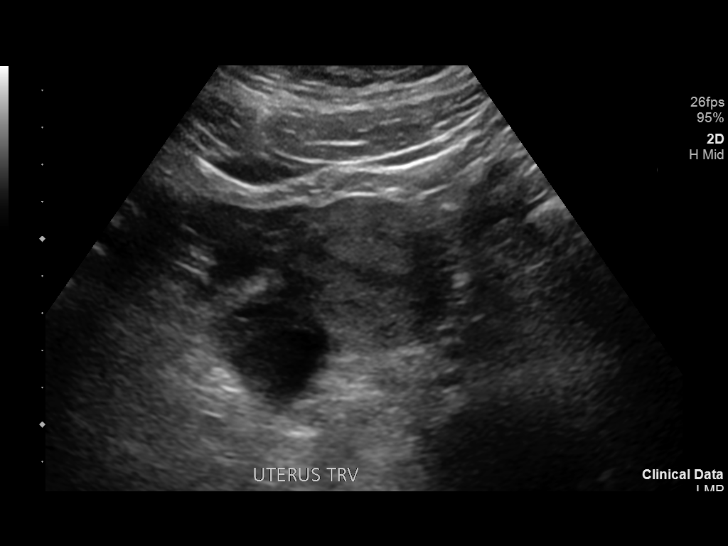
[im 28/111]
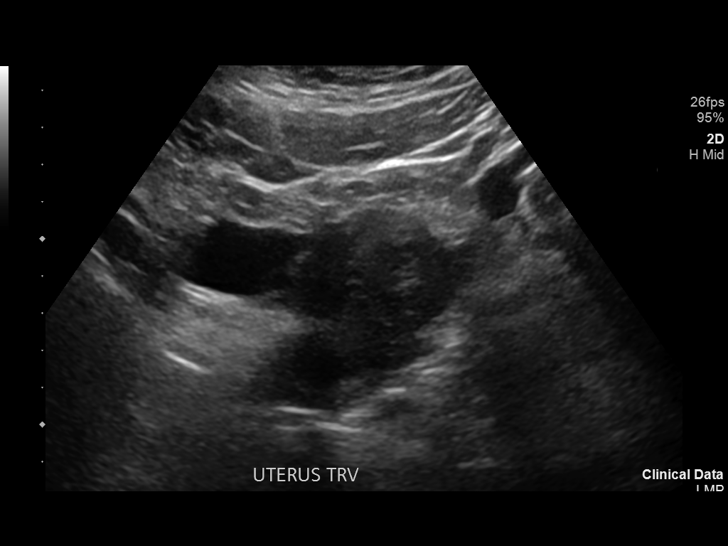
[im 37/111]
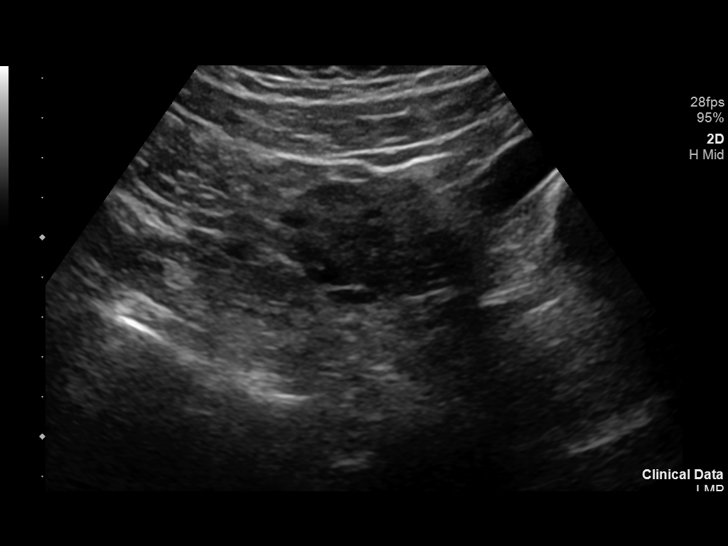
[im 46/111]
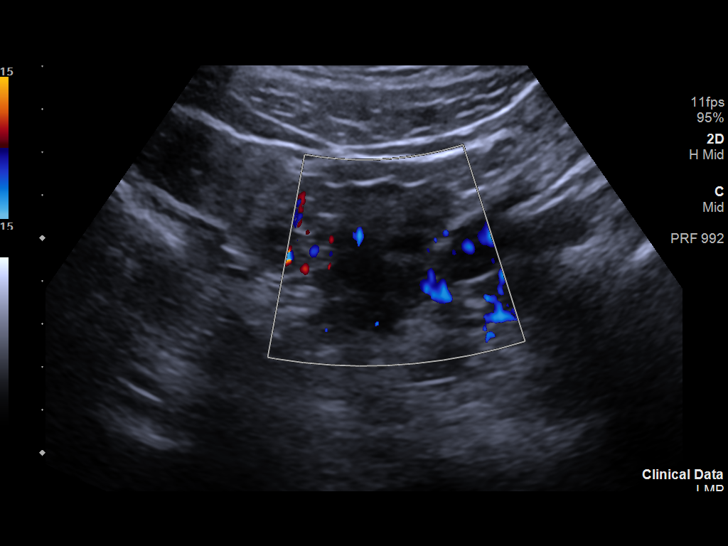
[im 56/111]
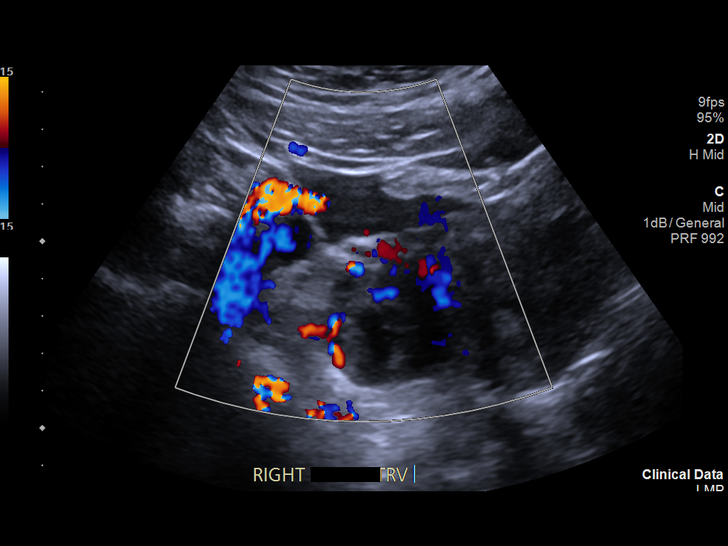
[im 65/111]
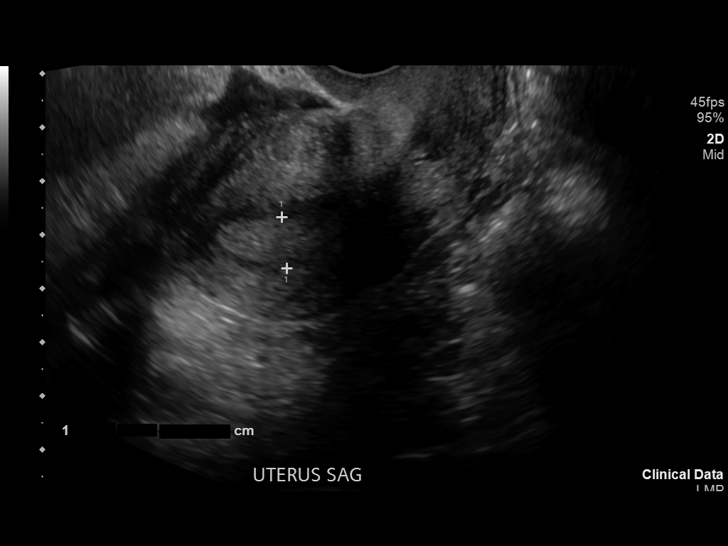
[im 74/111]
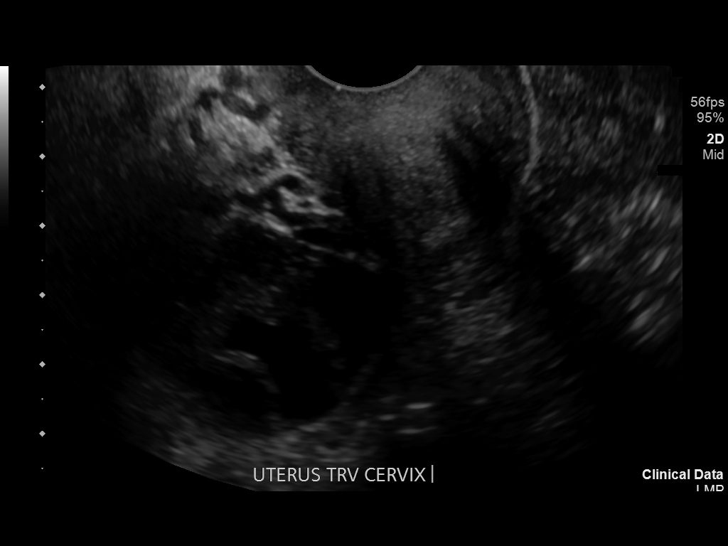
[im 83/111]
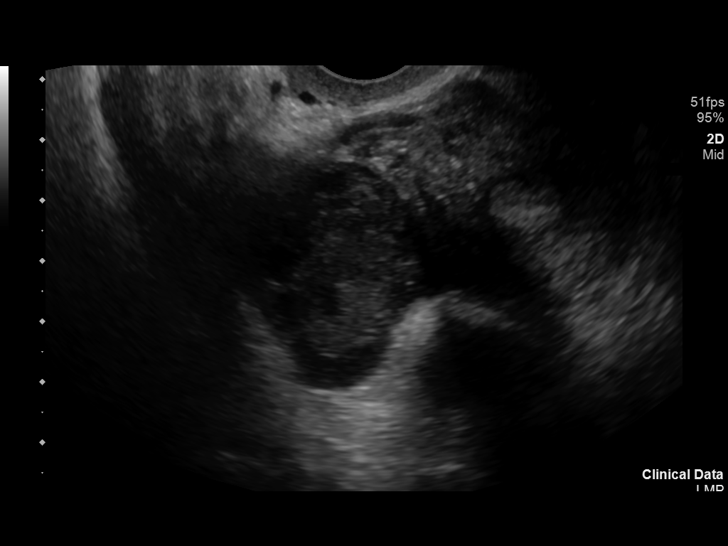
[im 92/111]
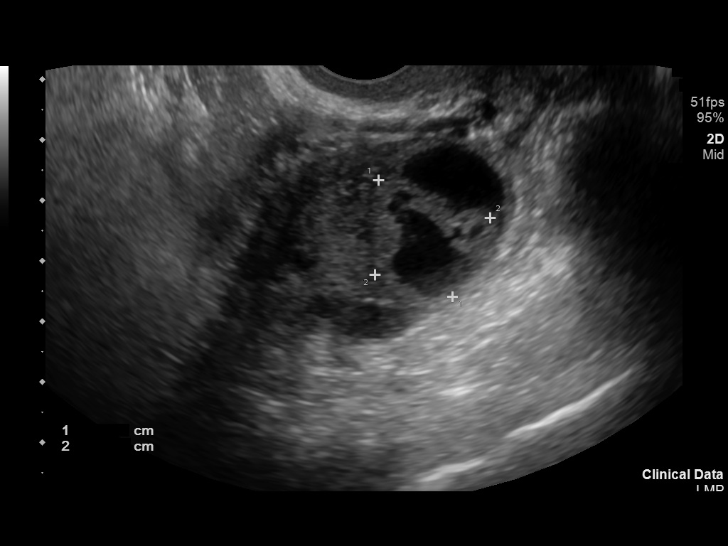
[im 101/111]
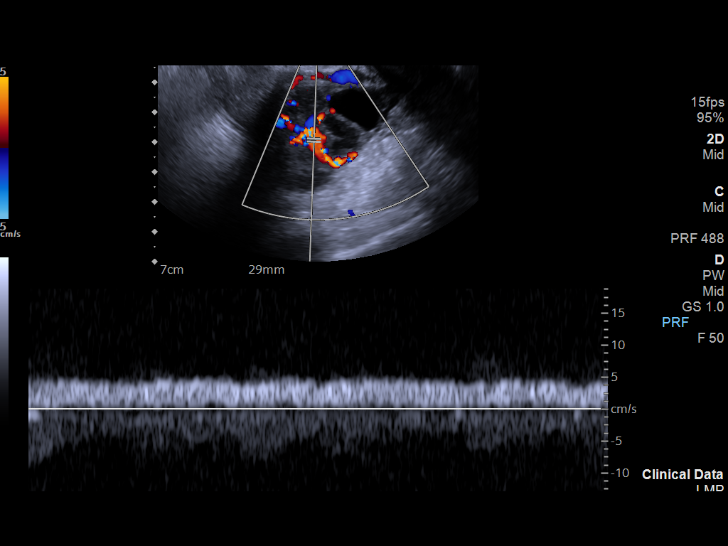
[im 111/111]
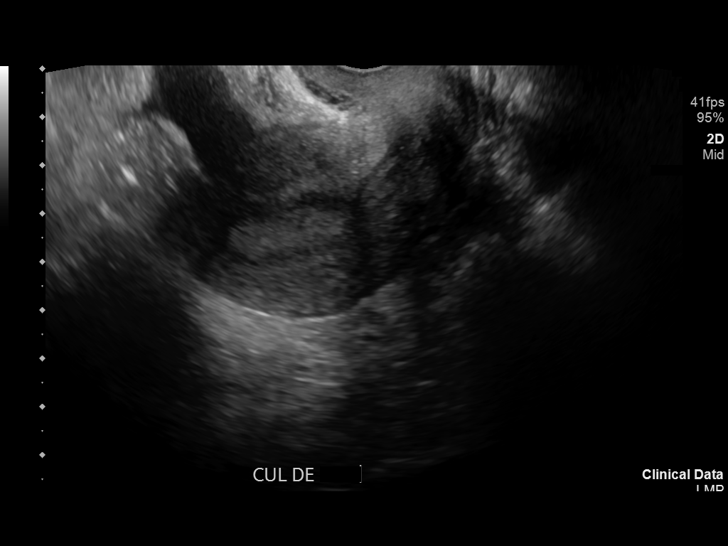

[13 of 25 positions shown; findings below may reference images not displayed]

FINDINGS: Uterus

Measurements: 7 4 x 4.0 x 4.5 cm = volume: 69.5 mL. No fibroids or
other mass visualized.

Endometrium

Thickness: 10 mm.  No focal abnormality visualized.

Right ovary

Measurements: 4.3 x 3.2 x 3.7 cm = volume: 26.6 mL. 2.3 x 2.1 x
cm complex cyst with peripherally increased vascularity, most
characteristic of a degenerating corpus luteal cyst.

Left ovary

Measurements: 3.8 x 2.9 x 2.7 cm = volume: 15.3 mL. Normal
appearance/no adnexal mass.

Pulsed Doppler evaluation of both ovaries demonstrates normal
low-resistance arterial and venous waveforms.

Other findings

Small volume free fluid seen within the right adnexa.
IMPRESSION: 1. 2.4 cm complex right ovarian cyst, most characteristic of a
degenerating corpus luteal cyst. Associated small volume free
physiologic fluid within the right adnexa. Findings could contribute
to acute right lower quadrant pain.
2. No evidence for torsion or other acute abnormality.
